# Patient Record
Sex: Male | Born: 1965 | Race: White | Hispanic: No | Marital: Single | Smoking: Former smoker
Health system: Southern US, Community
[De-identification: ages and names within clinical notes are randomized; demographics above are authoritative.]

## PROBLEM LIST (undated history)

## (undated) DIAGNOSIS — F419 Anxiety disorder, unspecified: Secondary | ICD-10-CM

## (undated) DIAGNOSIS — I2510 Atherosclerotic heart disease of native coronary artery without angina pectoris: Secondary | ICD-10-CM

## (undated) DIAGNOSIS — E039 Hypothyroidism, unspecified: Secondary | ICD-10-CM

## (undated) DIAGNOSIS — N12 Tubulo-interstitial nephritis, not specified as acute or chronic: Secondary | ICD-10-CM

## (undated) DIAGNOSIS — B338 Other specified viral diseases: Secondary | ICD-10-CM

## (undated) HISTORY — DX: Other specified viral diseases: B33.8

## (undated) HISTORY — PX: APPENDECTOMY: SHX54

## (undated) HISTORY — DX: Other specified viral diseases: N12

## (undated) HISTORY — DX: Anxiety disorder, unspecified: F41.9

## (undated) HISTORY — PX: OTHER SURGICAL HISTORY: SHX169

## (undated) HISTORY — DX: Tubulo-interstitial nephritis, not specified as acute or chronic: N12

---

## 1998-07-16 ENCOUNTER — Encounter: Payer: Self-pay | Admitting: Emergency Medicine

## 1998-07-16 ENCOUNTER — Emergency Department (HOSPITAL_COMMUNITY): Admission: EM | Admit: 1998-07-16 | Discharge: 1998-07-16 | Payer: Self-pay | Admitting: Emergency Medicine

## 2010-07-27 ENCOUNTER — Encounter: Payer: Self-pay | Admitting: Family Medicine

## 2010-12-08 ENCOUNTER — Emergency Department (INDEPENDENT_AMBULATORY_CARE_PROVIDER_SITE_OTHER): Payer: BC Managed Care – PPO

## 2010-12-08 ENCOUNTER — Emergency Department (HOSPITAL_BASED_OUTPATIENT_CLINIC_OR_DEPARTMENT_OTHER)
Admission: EM | Admit: 2010-12-08 | Discharge: 2010-12-08 | Disposition: A | Discharge status: Home / Self Care | Payer: BC Managed Care – PPO | Attending: Emergency Medicine | Admitting: Emergency Medicine

## 2010-12-08 DIAGNOSIS — E039 Hypothyroidism, unspecified: Secondary | ICD-10-CM | POA: Insufficient documentation

## 2010-12-08 DIAGNOSIS — W19XXXA Unspecified fall, initial encounter: Secondary | ICD-10-CM

## 2010-12-08 DIAGNOSIS — S6390XA Sprain of unspecified part of unspecified wrist and hand, initial encounter: Secondary | ICD-10-CM | POA: Insufficient documentation

## 2010-12-08 DIAGNOSIS — M25549 Pain in joints of unspecified hand: Secondary | ICD-10-CM

## 2010-12-08 DIAGNOSIS — F17200 Nicotine dependence, unspecified, uncomplicated: Secondary | ICD-10-CM | POA: Insufficient documentation

## 2010-12-08 DIAGNOSIS — M25539 Pain in unspecified wrist: Secondary | ICD-10-CM

## 2010-12-08 DIAGNOSIS — R609 Edema, unspecified: Secondary | ICD-10-CM

## 2013-08-18 ENCOUNTER — Encounter: Payer: Self-pay | Admitting: *Deleted

## 2013-08-18 DIAGNOSIS — E039 Hypothyroidism, unspecified: Secondary | ICD-10-CM | POA: Insufficient documentation

## 2014-11-28 ENCOUNTER — Encounter (HOSPITAL_COMMUNITY): Payer: Self-pay | Admitting: Emergency Medicine

## 2014-11-28 ENCOUNTER — Observation Stay (HOSPITAL_COMMUNITY)
Admission: EM | Admit: 2014-11-28 | Discharge: 2014-11-30 | Disposition: A | Discharge status: Home / Self Care | Payer: Worker's Compensation | Attending: Cardiovascular Disease | Admitting: Cardiovascular Disease

## 2014-11-28 ENCOUNTER — Emergency Department (HOSPITAL_COMMUNITY): Payer: Worker's Compensation

## 2014-11-28 DIAGNOSIS — Z8249 Family history of ischemic heart disease and other diseases of the circulatory system: Secondary | ICD-10-CM | POA: Insufficient documentation

## 2014-11-28 DIAGNOSIS — Z6841 Body Mass Index (BMI) 40.0 and over, adult: Secondary | ICD-10-CM | POA: Insufficient documentation

## 2014-11-28 DIAGNOSIS — F419 Anxiety disorder, unspecified: Secondary | ICD-10-CM | POA: Diagnosis not present

## 2014-11-28 DIAGNOSIS — I2510 Atherosclerotic heart disease of native coronary artery without angina pectoris: Secondary | ICD-10-CM | POA: Diagnosis not present

## 2014-11-28 DIAGNOSIS — E785 Hyperlipidemia, unspecified: Secondary | ICD-10-CM | POA: Insufficient documentation

## 2014-11-28 DIAGNOSIS — Z87891 Personal history of nicotine dependence: Secondary | ICD-10-CM | POA: Insufficient documentation

## 2014-11-28 DIAGNOSIS — R079 Chest pain, unspecified: Secondary | ICD-10-CM | POA: Diagnosis present

## 2014-11-28 DIAGNOSIS — N058 Unspecified nephritic syndrome with other morphologic changes: Secondary | ICD-10-CM | POA: Diagnosis not present

## 2014-11-28 DIAGNOSIS — I200 Unstable angina: Secondary | ICD-10-CM | POA: Diagnosis not present

## 2014-11-28 DIAGNOSIS — E039 Hypothyroidism, unspecified: Secondary | ICD-10-CM | POA: Diagnosis present

## 2014-11-28 HISTORY — DX: Hypothyroidism, unspecified: E03.9

## 2014-11-28 HISTORY — DX: Atherosclerotic heart disease of native coronary artery without angina pectoris: I25.10

## 2014-11-28 HISTORY — DX: Morbid (severe) obesity due to excess calories: E66.01

## 2014-11-28 LAB — COMPREHENSIVE METABOLIC PANEL
ALK PHOS: 57 U/L (ref 38–126)
ALT: 39 U/L (ref 17–63)
ANION GAP: 8 (ref 5–15)
AST: 31 U/L (ref 15–41)
Albumin: 4 g/dL (ref 3.5–5.0)
BUN: 18 mg/dL (ref 6–20)
CHLORIDE: 112 mmol/L — AB (ref 101–111)
CO2: 21 mmol/L — AB (ref 22–32)
Calcium: 9.2 mg/dL (ref 8.9–10.3)
Creatinine, Ser: 1.16 mg/dL (ref 0.61–1.24)
GFR calc Af Amer: >60 mL/min (ref >60)
GFR calc non Af Amer: >60 mL/min (ref >60)
GLUCOSE: 86 mg/dL (ref 65–99)
POTASSIUM: 4.1 mmol/L (ref 3.5–5.1)
Sodium: 141 mmol/L (ref 135–145)
Total Bilirubin: 0.6 mg/dL (ref 0.3–1.2)
Total Protein: 6.6 g/dL (ref 6.5–8.1)

## 2014-11-28 LAB — CBC WITH DIFFERENTIAL/PLATELET
BASOS ABS: 0 10*3/uL (ref 0.0–0.1)
BASOS PCT: 0 % (ref 0–1)
Eosinophils Absolute: 0.1 10*3/uL (ref 0.0–0.7)
Eosinophils Relative: 1 % (ref 0–5)
HCT: 40 % (ref 39.0–52.0)
Hemoglobin: 13.7 g/dL (ref 13.0–17.0)
Lymphocytes Relative: 24 % (ref 12–46)
Lymphs Abs: 2 10*3/uL (ref 0.7–4.0)
MCH: 32.2 pg (ref 26.0–34.0)
MCHC: 34.3 g/dL (ref 30.0–36.0)
MCV: 94.1 fL (ref 78.0–100.0)
MONOS PCT: 11 % (ref 3–12)
Monocytes Absolute: 0.9 10*3/uL (ref 0.1–1.0)
NEUTROS PCT: 64 % (ref 43–77)
Neutro Abs: 5.1 10*3/uL (ref 1.7–7.7)
PLATELETS: 183 10*3/uL (ref 150–400)
RBC: 4.25 MIL/uL (ref 4.22–5.81)
RDW: 12.6 % (ref 11.5–15.5)
WBC: 8 10*3/uL (ref 4.0–10.5)

## 2014-11-28 LAB — TSH: TSH: 0.259 u[IU]/mL — ABNORMAL LOW (ref 0.350–4.500)

## 2014-11-28 LAB — TROPONIN I: Troponin I: <0.03 ng/mL (ref <0.031)

## 2014-11-28 MED ORDER — HEPARIN SODIUM (PORCINE) 5000 UNIT/ML IJ SOLN
5000.0000 [IU] | Freq: Three times a day (TID) | INTRAMUSCULAR | Status: DC
  Administered 2014-11-29 – 2014-11-30 (×3): 5000 [IU] via SUBCUTANEOUS
  Filled 2014-11-28 (×3): qty 1

## 2014-11-28 MED ORDER — METOPROLOL TARTRATE 12.5 MG HALF TABLET
12.5000 mg | ORAL_TABLET | Freq: Two times a day (BID) | ORAL | Status: DC
  Administered 2014-11-28 – 2014-11-30 (×4): 12.5 mg via ORAL
  Filled 2014-11-28 (×4): qty 1

## 2014-11-28 MED ORDER — LEVOTHYROXINE SODIUM 25 MCG PO TABS
125.0000 ug | ORAL_TABLET | Freq: Every day | ORAL | Status: DC
  Administered 2014-11-29 – 2014-11-30 (×2): 125 ug via ORAL
  Filled 2014-11-28 (×4): qty 1

## 2014-11-28 MED ORDER — ACETAMINOPHEN 325 MG PO TABS: 650.0000 mg | ORAL_TABLET | ORAL | Status: DC | PRN

## 2014-11-28 MED ORDER — ASPIRIN EC 81 MG PO TBEC
81.0000 mg | DELAYED_RELEASE_TABLET | Freq: Every day | ORAL | Status: DC
  Filled 2014-11-28: qty 1

## 2014-11-28 MED ORDER — SODIUM CHLORIDE 0.9 % IJ SOLN
3.0000 mL | Freq: Two times a day (BID) | INTRAMUSCULAR | Status: DC
  Administered 2014-11-28 – 2014-11-29 (×2): 3 mL via INTRAVENOUS

## 2014-11-28 MED ORDER — LAMOTRIGINE 100 MG PO TABS
100.0000 mg | ORAL_TABLET | Freq: Every day | ORAL | Status: DC
  Administered 2014-11-28 – 2014-11-29 (×2): 100 mg via ORAL
  Filled 2014-11-28 (×2): qty 1

## 2014-11-28 MED ORDER — NITROGLYCERIN 0.4 MG SL SUBL: 0.4000 mg | SUBLINGUAL_TABLET | SUBLINGUAL | Status: DC | PRN

## 2014-11-28 MED ORDER — SODIUM CHLORIDE 0.9 % IV SOLN: 250.0000 mL | INTRAVENOUS | Status: DC | PRN

## 2014-11-28 MED ORDER — SODIUM CHLORIDE 0.9 % WEIGHT BASED INFUSION
1.0000 mL/kg/h | INTRAVENOUS | Status: DC
  Administered 2014-11-29: 1 mL/kg/h via INTRAVENOUS

## 2014-11-28 MED ORDER — DIVALPROEX SODIUM ER 500 MG PO TB24
1500.0000 mg | ORAL_TABLET | Freq: Every day | ORAL | Status: DC
  Administered 2014-11-28 – 2014-11-29 (×2): 1500 mg via ORAL
  Filled 2014-11-28 (×4): qty 3

## 2014-11-28 MED ORDER — ASPIRIN 81 MG PO CHEW
81.0000 mg | CHEWABLE_TABLET | ORAL | Status: AC
  Administered 2014-11-29: 81 mg via ORAL
  Filled 2014-11-28: qty 1

## 2014-11-28 MED ORDER — SODIUM CHLORIDE 0.9 % IJ SOLN: 3.0000 mL | INTRAMUSCULAR | Status: DC | PRN

## 2014-11-28 MED ORDER — ATORVASTATIN CALCIUM 80 MG PO TABS
80.0000 mg | ORAL_TABLET | Freq: Every day | ORAL | Status: DC
  Administered 2014-11-29: 80 mg via ORAL
  Filled 2014-11-28: qty 1

## 2014-11-28 MED ORDER — ONDANSETRON HCL 4 MG/2ML IJ SOLN: 4.0000 mg | Freq: Four times a day (QID) | INTRAMUSCULAR | Status: DC | PRN

## 2014-11-28 MED ORDER — SODIUM CHLORIDE 0.9 % IV SOLN
INTRAVENOUS | Status: DC
  Administered 2014-11-28: 10 mL/h via INTRAVENOUS

## 2014-11-28 MED ORDER — SODIUM CHLORIDE 0.9 % IJ SOLN: 3.0000 mL | Freq: Two times a day (BID) | INTRAMUSCULAR | Status: DC

## 2014-11-28 MED ORDER — SODIUM CHLORIDE 0.9 % WEIGHT BASED INFUSION
3.0000 mL/kg/h | INTRAVENOUS | Status: AC
  Administered 2014-11-29: 3 mL/kg/h via INTRAVENOUS

## 2014-11-28 NOTE — ED Provider Notes (Signed)
CSN: [id number]     Arrival date & time 11/28/14  1333 History   First MD Initiated Contact with Patient 11/28/14 1349     Chief Complaint  Patient presents with  . Chest Pain     (Consider location/radiation/quality/duration/timing/severity/associated sxs/prior Treatment) HPI Comments: Patient here complaining of sudden onset of substernal chest pain while doing agility skills. 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 Symptoms improved when he rested. No prior history of CAD. EMS called and patient given aspirin and transported here. Continues to note slight pleuritic pain to left side of his chest. Denies any leg pain or swelling.  Patient is a 49 y.o. male presenting with chest pain. The history is provided by the patient.  Chest Pain   Past Medical History  Diagnosis Date  . Thyroid disease   . Anxiety   . 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 Appendectomy    . Varicose vein stripping     Family History  Problem Relation Age of Onset  . Cancer Mother   . Alzheimer's disease Father   . Heart attack Paternal Uncle   . Heart attack Maternal Grandfather    History  Substance Use Topics  . Smoking status: Former Smoker    Quit date: 02/15/2005  . Smokeless tobacco: Never Used  . Alcohol Use: Yes    Review of Systems  Cardiovascular: Positive for chest pain.  All other systems reviewed and are negative.     Allergies  Penicillin g potassium in d5w  Home Medications   Prior to Admission medications   Medication Sig Start Date End Date Taking? Authorizing Provider  divalproex (DEPAKOTE) 500 MG DR tablet Take 500 mg by mouth 3 (three) times daily.    Historical Provider, MD  lamoTRIgine (LAMICTAL) 100 MG tablet Take 100 mg by mouth daily.    Historical Provider, MD  levothyroxine (SYNTHROID, LEVOTHROID) 125  MCG tablet Take 125 mcg by mouth daily before breakfast.    Historical Provider, MD   BP 125/83 mmHg  Pulse 82  Temp(Src) 98.4 F (36.9 C) (Oral)  Resp 16  Ht 5\' 10"  (1.778 m)  Wt 310 lb (140.615 kg)  BMI 44.48 kg/m2  SpO2 100% Physical Exam  Constitutional: He is oriented to person, place, and time. He appears well-developed and well-nourished.  Non-toxic appearance. No distress.  HENT:  Head: Normocephalic and atraumatic.  Eyes: Conjunctivae, EOM and lids are normal. Pupils are equal, round, and reactive to light.  Neck: Normal range of motion. Neck supple. No tracheal deviation present. No thyroid mass present.  Cardiovascular: Normal rate, regular rhythm and normal heart sounds.  Exam reveals no gallop.   No murmur heard. Pulmonary/Chest: Effort normal and breath sounds normal. No stridor. No respiratory distress. He has no decreased breath sounds. He has no wheezes. He has no rhonchi. He has no rales.  Abdominal: Soft. Normal appearance and bowel sounds are normal. He exhibits no distension. There is no tenderness. There is no rebound and no CVA tenderness.  Musculoskeletal: Normal range of motion. He exhibits no edema or tenderness.  Neurological: He is alert and oriented to person, place, and time. He has normal strength. No cranial nerve deficit or sensory deficit. GCS eye subscore is 4. GCS verbal subscore is 5. GCS motor subscore is 6.  Skin: Skin is warm and dry. No abrasion and no  rash noted.  Psychiatric: He has a normal mood and affect. His speech is normal and behavior is normal.  Nursing note and vitals reviewed.   ED Course  Procedures (including critical care time) Labs Review Labs Reviewed  TROPONIN I  CBC WITH DIFFERENTIAL/PLATELET  COMPREHENSIVE METABOLIC PANEL    Imaging Review No results found.   EKG Interpretation   Date/Time:  Wednesday Nov 28 2014 13:40:23 EDT Ventricular Rate:  83 PR Interval:  173 QRS Duration: 91 QT Interval:  370 QTC  Calculation: 435 R Axis:   53 Text Interpretation:  Sinus rhythm No significant change since last  tracing Confirmed by Aiken  MD, Tiny Rietz ([id number]) on 11/28/2014 2:02:03 PM      MDM   Final diagnoses:  Chest pain     Patient with symptoms concerning for exertional angina. Will consult cardiology   Lacretia Leigh, MD 11/28/14 [phone]

## 2014-11-28 NOTE — ED Notes (Signed)
MD Jamicah at bedside.

## 2014-11-28 NOTE — ED Notes (Signed)
MD at bedside. Cardiology 

## 2014-11-28 NOTE — ED Notes (Signed)
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 Pt fell to the ground but denies any LOC states he just felt weak. On arrival to ED pt denies and chest pain or SOB. Pt is warm and dry.

## 2014-11-28 NOTE — H&P (Signed)
Patient ID: Jerome Rogers MRN: [id number], DOB/AGE: Apr 08, 1966   Admit date: 11/28/2014  Primary Physician: Orpah Melter, MD Primary Cardiologist: new - seen by M. Rony Ratz, MD   Pt. Profile:  49 year old male with PMH significant for hypothyroidism and anxiety presenting with substernal chest pain during physical exertion.   Problem List  Past Medical History  Diagnosis Date  . Hypothyroidism   . Anxiety   . 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 Morbid obesity     Past Surgical History  Procedure Laterality Date  . Appendectomy    . Varicose vein stripping       Allergies  Allergies  Allergen Reactions  . Penicillin G Potassium In D5w     Unknown     HPI  Jerome Rogers is a 49 year old male with PMH significant per above. 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 He says that after dragging a heavy fire hose over a long distance, he immediately felt an intense, squeezing pain in his left chest. He was then supposed to climb up a tall ladder, but was unable to do this due to pain and sat down to rest. The pain spontaneously resolved with 5 minutes of rest. After resting, he only noticed the pain when taking a deep breath, which he continues to experience now in the ED. The pain now feels like a squeezing sensation just medial to the left nipple when he takes a deep breath. The character of the pain does not change with position. He states that the pain feels superficial as opposed to deep in his chest. Chest wall is not tender to palpation. He is not in distress and he is not having continuous or severe chest pain.   He is taking his synthroid as prescribed.   Home Medications  Prior to Admission medications   Medication Sig Start Date End Date Taking? Authorizing Provider  levothyroxine (SYNTHROID, LEVOTHROID) 125 MCG tablet Take 125 mcg by mouth daily  before breakfast.   Yes Historical Provider, MD    Family History  Family History  Problem Relation Age of Onset  . Cancer Mother     alive and well.  . Alzheimer's disease Father   . Heart attack Paternal Uncle   . Heart attack Maternal Grandfather   . CAD Father     cabg and redo cabg - began in late 83's.  died @ 48.  . Parkinson's disease Father   . Other Sister     alive and well.    Social History  History   Social History  . Marital Status: Single    Spouse Name: N/A  . Number of Children: N/A  . Years of Education: N/A   Occupational History  . Not on file.   Social History Main Topics  . Smoking status: Former Smoker -- 1.00 packs/day for 30 years    Types: Cigarettes    Quit date: 02/15/2005  . Smokeless tobacco: Never Used  . Alcohol Use: 0.0 oz/week    0 Standard drinks or equivalent per week     Comment: social - not everyday.  . Drug Use: No  . Sexual Activity: Not on file   Other Topics Concern  . Not on file   Social History Narrative   Lives with Deale, dtr (14), and son (80).  Works for [employer] and volunteers @ others.  Review of Systems General:  No chills, fever, night sweats or weight changes.  Cardiovascular:  Positive for chest pain. No dyspnea on exertion, edema, orthopnea, palpitations, paroxysmal nocturnal dyspnea. Dermatological: No rash, lesions/masses Respiratory: No cough, dyspnea Urologic: No hematuria, dysuria Abdominal:   +++ nausea, no vomiting, diarrhea, bright red blood per rectum, melena, or hematemesis Neurologic:  No visual changes, wkns, changes in mental status. All other systems reviewed and are otherwise negative except as noted above.  Physical Exam  Blood pressure 114/83, pulse 68, temperature 98.4 F (36.9 C), temperature source Oral, resp. rate 14, height 5\' 10"  (1.778 m), weight 310 lb (140.615 kg), SpO2 98 %.  General: Pleasant, morbidly obese, NAD Psych: Normal affect. Neuro: Alert and  oriented X 3. Moves all extremities spontaneously. HEENT: Normal  Neck: Supple without bruits or JVD. Lungs:  Resp regular and unlabored, CTA. Heart: RRR no s3, s4, or murmurs. Abdomen: Soft, non-tender, non-distended, BS + x 4.  Extremities: No clubbing, cyanosis or edema. DP/PT/Radials 2+ and equal bilaterally.  Labs   Recent Labs  11/28/14 1428  TROPONINI <0.03   Lab Results  Component Value Date   WBC 8.0 11/28/2014   HGB 13.7 11/28/2014   HCT 40.0 11/28/2014   MCV 94.1 11/28/2014   PLT 183 11/28/2014     Recent Labs Lab 11/28/14 1428  NA 141  K 4.1  CL 112*  CO2 21*  BUN 18  CREATININE 1.16  CALCIUM 9.2  PROT 6.6  BILITOT 0.6  ALKPHOS 57  ALT 39  AST 31  GLUCOSE 86    Radiology/Studies  Dg Chest 2 View  11/28/2014   CLINICAL DATA:  Acute onset chest pain and weakness  EXAM: CHEST  2 VIEW  COMPARISON:  None.  FINDINGS: There is minimal scarring in the left base. Lungs elsewhere clear. Heart size and pulmonary vascularity are normal. No adenopathy. Pneumothorax. There is degenerative change in the thoracic spine.  IMPRESSION: No edema or consolidation.  Slight scarring left base.   Electronically Signed   By: Lowella Grip III M.D.   On: 11/28/2014 14:56   ECG  NSR, rate 83, good R wave progression.   ASSESSMENT AND PLAN  1. Unstable Angina:  49 y/o male w/o prior cardiac hx presented to ED today with exertional c/p during FD workout, relieved with rest.  He still has some residual pleuritic c/p.  No obj evidence of ischemia.  RF for CAD include FH (father), obesity, and reported untreated HL. Admit, cycle CE.  Add asa, statin, bb.  Add heparin if he rules in.  Plan cath in AM.  The patient understands that risks include but are not limited to stroke (1 in 1000), death (1 in 1000), kidney failure [usually temporary] (1 in 500), bleeding (1 in 200), allergic reaction [possibly serious] (1 in 200), and agrees to proceed.    2.  HL:  He reports a h/o  untreated HL.  Add statin in setting of above.  Check lipids in AM.  LFT's wnl.  3.  Hypothyroidism:  Cont synthroid.  Check TSH.  4.  Morbid Obesity: Would benefit from outpt nutritional counseling.  Signed, Murray Hodgkins, NP 11/28/2014, 5:06 PM  I have seen and examined the patient along with Murray Hodgkins, NP.  I have reviewed the chart, notes and new data.  I agree with PA/NP's note.  Key new complaints: while not entirely typical, symptoms suggest angina pectoris; numerous coronary risk factors include gender, obesity with abdominal distribution, untreated hypercholesterolemia,  family history of premature CAD  In his father and paternal grandfather and previous smoking Key examination changes: no overt CHF, no arrhythmia, clear lung exam, no pleural or pericardial rub Key new findings / data: ECG and labs normal  PLAN: His body habitus makes it likely that a nuclear perfusion study or other noninvasive imaging study will have low specificity. Recommend proceeding directly to coronary angiography for what appears to be exertional angina with protracted symptoms after rest.  Sanda Klein, MD, [place] [phone] 11/28/2014, 5:30 PM

## 2014-11-29 ENCOUNTER — Encounter (HOSPITAL_COMMUNITY)
Admission: EM | Disposition: A | Discharge status: Home / Self Care | Payer: Self-pay | Source: Home / Self Care | Attending: Emergency Medicine

## 2014-11-29 DIAGNOSIS — F419 Anxiety disorder, unspecified: Secondary | ICD-10-CM | POA: Diagnosis not present

## 2014-11-29 DIAGNOSIS — I25110 Atherosclerotic heart disease of native coronary artery with unstable angina pectoris: Secondary | ICD-10-CM | POA: Diagnosis not present

## 2014-11-29 DIAGNOSIS — E785 Hyperlipidemia, unspecified: Secondary | ICD-10-CM

## 2014-11-29 DIAGNOSIS — R079 Chest pain, unspecified: Secondary | ICD-10-CM | POA: Insufficient documentation

## 2014-11-29 DIAGNOSIS — N058 Unspecified nephritic syndrome with other morphologic changes: Secondary | ICD-10-CM | POA: Diagnosis not present

## 2014-11-29 DIAGNOSIS — I2510 Atherosclerotic heart disease of native coronary artery without angina pectoris: Secondary | ICD-10-CM | POA: Diagnosis not present

## 2014-11-29 HISTORY — PX: CARDIAC CATHETERIZATION: SHX172

## 2014-11-29 LAB — LIPID PANEL
CHOL/HDL RATIO: 5.6 ratio
CHOLESTEROL: 219 mg/dL — AB (ref 0–200)
HDL: 39 mg/dL — ABNORMAL LOW (ref >40)
LDL Cholesterol: 142 mg/dL — ABNORMAL HIGH (ref 0–99)
Triglycerides: 189 mg/dL — ABNORMAL HIGH (ref <150)
VLDL: 38 mg/dL (ref 0–40)

## 2014-11-29 LAB — TROPONIN I
Troponin I: <0.03 ng/mL (ref <0.031)
Troponin I: <0.03 ng/mL (ref <0.031)

## 2014-11-29 LAB — PROTIME-INR
INR: 1.25 (ref 0.00–1.49)
Prothrombin Time: 15.8 seconds — ABNORMAL HIGH (ref 11.6–15.2)

## 2014-11-29 LAB — POCT ACTIVATED CLOTTING TIME: Activated Clotting Time: 165 seconds

## 2014-11-29 SURGERY — LEFT HEART CATH AND CORONARY ANGIOGRAPHY

## 2014-11-29 MED ORDER — VERAPAMIL HCL 2.5 MG/ML IV SOLN
INTRAVENOUS | Status: DC | PRN
  Administered 2014-11-29: 17:00:00 via INTRA_ARTERIAL

## 2014-11-29 MED ORDER — MIDAZOLAM HCL 2 MG/2ML IJ SOLN
INTRAMUSCULAR | Status: DC | PRN
  Administered 2014-11-29: 2 mg via INTRAVENOUS
  Administered 2014-11-29: 1 mg via INTRAVENOUS

## 2014-11-29 MED ORDER — LIDOCAINE HCL (PF) 1 % IJ SOLN
INTRAMUSCULAR | Status: AC
  Filled 2014-11-29: qty 30

## 2014-11-29 MED ORDER — LIDOCAINE HCL (PF) 1 % IJ SOLN
INTRAMUSCULAR | Status: DC | PRN
  Administered 2014-11-29: 10 mL via INTRADERMAL
  Administered 2014-11-29: 5 mL via INTRADERMAL

## 2014-11-29 MED ORDER — SODIUM CHLORIDE 0.9 % IJ SOLN: 3.0000 mL | INTRAMUSCULAR | Status: DC | PRN

## 2014-11-29 MED ORDER — SODIUM CHLORIDE 0.9 % IV SOLN: 250.0000 mL | INTRAVENOUS | Status: DC | PRN

## 2014-11-29 MED ORDER — FENTANYL CITRATE (PF) 100 MCG/2ML IJ SOLN
INTRAMUSCULAR | Status: AC
  Filled 2014-11-29: qty 2

## 2014-11-29 MED ORDER — SODIUM CHLORIDE 0.9 % IJ SOLN
3.0000 mL | INTRAMUSCULAR | Status: DC | PRN
Start: 2014-11-29 — End: 2014-11-29

## 2014-11-29 MED ORDER — MIDAZOLAM HCL 2 MG/2ML IJ SOLN
INTRAMUSCULAR | Status: AC
  Filled 2014-11-29: qty 2

## 2014-11-29 MED ORDER — NITROGLYCERIN 1 MG/10 ML FOR IR/CATH LAB
INTRA_ARTERIAL | Status: AC
  Filled 2014-11-29: qty 10

## 2014-11-29 MED ORDER — FENTANYL CITRATE (PF) 100 MCG/2ML IJ SOLN
INTRAMUSCULAR | Status: DC | PRN
  Administered 2014-11-29: 50 ug via INTRAVENOUS
  Administered 2014-11-29: 25 ug via INTRAVENOUS

## 2014-11-29 MED ORDER — SODIUM CHLORIDE 0.9 % IV SOLN
INTRAVENOUS | Status: AC
  Administered 2014-11-29: 150 mL/h via INTRAVENOUS

## 2014-11-29 MED ORDER — ASPIRIN EC 81 MG PO TBEC
81.0000 mg | DELAYED_RELEASE_TABLET | Freq: Every day | ORAL | Status: DC
  Administered 2014-11-30: 81 mg via ORAL
  Filled 2014-11-29: qty 1

## 2014-11-29 MED ORDER — ASPIRIN 81 MG PO CHEW: 81.0000 mg | CHEWABLE_TABLET | ORAL | Status: DC

## 2014-11-29 MED ORDER — SODIUM CHLORIDE 0.9 % IJ SOLN: 3.0000 mL | Freq: Two times a day (BID) | INTRAMUSCULAR | Status: DC

## 2014-11-29 MED ORDER — SODIUM CHLORIDE 0.9 % IJ SOLN
3.0000 mL | Freq: Two times a day (BID) | INTRAMUSCULAR | Status: DC
  Administered 2014-11-29 – 2014-11-30 (×2): 3 mL via INTRAVENOUS

## 2014-11-29 MED ORDER — SODIUM CHLORIDE 0.9 % IV SOLN
Freq: Once | INTRAVENOUS | Status: AC
  Administered 2014-11-29: 12:00:00 via INTRAVENOUS

## 2014-11-29 MED ORDER — HEPARIN SODIUM (PORCINE) 1000 UNIT/ML IJ SOLN
INTRAMUSCULAR | Status: DC | PRN
  Administered 2014-11-29: 5000 [IU] via INTRAVENOUS

## 2014-11-29 MED ORDER — ACETAMINOPHEN 325 MG PO TABS
650.0000 mg | ORAL_TABLET | ORAL | Status: DC | PRN
  Administered 2014-11-30: 650 mg via ORAL
  Filled 2014-11-29: qty 2

## 2014-11-29 MED ORDER — HEPARIN (PORCINE) IN NACL 2-0.9 UNIT/ML-% IJ SOLN
INTRAMUSCULAR | Status: AC
  Filled 2014-11-29: qty 1000

## 2014-11-29 MED ORDER — ONDANSETRON HCL 4 MG/2ML IJ SOLN: 4.0000 mg | Freq: Four times a day (QID) | INTRAMUSCULAR | Status: DC | PRN

## 2014-11-29 SURGICAL SUPPLY — 19 items
CATH INFINITI 5 FR JL3.5 (CATHETERS) ×2 IMPLANT
CATH INFINITI 5FR ANG PIGTAIL (CATHETERS) ×3 IMPLANT
CATH INFINITI 5FR JL4 (CATHETERS) ×2 IMPLANT
CATH INFINITI 5FR MULTPACK ANG (CATHETERS) IMPLANT
CATH INFINITI JR4 5F (CATHETERS) ×2 IMPLANT
CATH LAUNCHER 5F RADL (CATHETERS) IMPLANT
CATH OPTITORQUE TIG 4.0 5F (CATHETERS) ×3 IMPLANT
CATHETER LAUNCHER 5F RADL (CATHETERS) ×3
DEVICE RAD COMP TR BAND LRG (VASCULAR PRODUCTS) ×3 IMPLANT
GLIDESHEATH SLEND A-KIT 6F 22G (SHEATH) ×3 IMPLANT
KIT HEART LEFT (KITS) ×3 IMPLANT
PACK CARDIAC CATHETERIZATION (CUSTOM PROCEDURE TRAY) ×3 IMPLANT
SHEATH PINNACLE 5F 10CM (SHEATH) ×2 IMPLANT
SYR MEDRAD MARK V 150ML (SYRINGE) ×3 IMPLANT
TRANSDUCER W/STOPCOCK (MISCELLANEOUS) ×3 IMPLANT
TUBING CIL FLEX 10 FLL-RA (TUBING) ×3 IMPLANT
WIRE EMERALD 3MM-J .035X150CM (WIRE) IMPLANT
WIRE HI TORQ VERSACORE-J 145CM (WIRE) ×2 IMPLANT
WIRE SAFE-T 1.5MM-J .035X260CM (WIRE) ×3 IMPLANT

## 2014-11-29 NOTE — Progress Notes (Signed)
Patient Name: Jerome Rogers Date of Encounter: 11/29/2014  Primary Cardiologist: new - seen by M. Croitoru, MD    Principal Problem:   Unstable angina Active Problems:   Hypothyroidism    SUBJECTIVE  Still having mild chest discomfort last night, worse when taking deep breath. Recounted what happened yesterday and said CP alleviated after resting.   CURRENT MEDS . aspirin EC  81 mg Oral Daily  . atorvastatin  80 mg Oral q1800  . divalproex  1,500 mg Oral Daily  . heparin  5,000 Units Subcutaneous 3 times per day  . lamoTRIgine  100 mg Oral Daily  . levothyroxine  125 mcg Oral QAC breakfast  . metoprolol tartrate  12.5 mg Oral BID  . sodium chloride  3 mL Intravenous Q12H  . sodium chloride  3 mL Intravenous Q12H    OBJECTIVE  Filed Vitals:   11/28/14 1745 11/28/14 1811 11/28/14 2017 11/29/14 0518  BP: 135/70 133/88 142/74 115/67  Pulse: 74 71 74 71  Temp:  97.7 F (36.5 C) 98 F (36.7 C) 98.4 F (36.9 C)  TempSrc:  Oral Oral Oral  Resp: 21 17 20 16   Height:  5\' 10"  (1.778 m)    Weight:  289 lb (131.09 kg)    SpO2: 99% 100% 97% 99%    Intake/Output Summary (Last 24 hours) at 11/29/14 0817 Last data filed at 11/29/14 0810  Gross per 24 hour  Intake 427.17 ml  Output      0 ml  Net 427.17 ml   Filed Weights   11/28/14 1347 11/28/14 1811  Weight: 310 lb (140.615 kg) 289 lb (131.09 kg)    PHYSICAL EXAM  General: Pleasant, NAD. Neuro: Alert and oriented X 3. Moves all extremities spontaneously. Psych: Normal affect. HEENT:  Normal  Neck: Supple without bruits or JVD. Lungs:  Resp regular and unlabored, CTA. Heart: RRR no s3, s4, or murmurs. Abdomen: Soft, non-tender, non-distended, BS + x 4.  Extremities: No clubbing, cyanosis or edema. DP/PT/Radials 2+ and equal bilaterally.  Accessory Clinical Findings  CBC  Recent Labs  11/28/14 1428  WBC 8.0  NEUTROABS 5.1  HGB 13.7  HCT 40.0  MCV 94.1  PLT 245   Basic Metabolic Panel  Recent  Labs  11/28/14 1428  NA 141  K 4.1  CL 112*  CO2 21*  GLUCOSE 86  BUN 18  CREATININE 1.16  CALCIUM 9.2   Liver Function Tests  Recent Labs  11/28/14 1428  AST 31  ALT 39  ALKPHOS 57  BILITOT 0.6  PROT 6.6  ALBUMIN 4.0   Cardiac Enzymes  Recent Labs  11/28/14 1428 11/28/14 1939 11/29/14 0056  TROPONINI <0.03 <0.03 <0.03   Fasting Lipid Panel  Recent Labs  11/29/14 0056  CHOL 219*  HDL 39*  LDLCALC 142*  TRIG 189*  CHOLHDL 5.6   Thyroid Function Tests  Recent Labs  11/28/14 1939  TSH 0.259*    TELE NSR     ECG  NSR without ST-T wave changes   Radiology/Studies  Dg Chest 2 View  11/28/2014   CLINICAL DATA:  Acute onset chest pain and weakness  EXAM: CHEST  2 VIEW  COMPARISON:  None.  FINDINGS: There is minimal scarring in the left base. Lungs elsewhere clear. Heart size and pulmonary vascularity are normal. No adenopathy. Pneumothorax. There is degenerative change in the thoracic spine.  IMPRESSION: No edema or consolidation.  Slight scarring left base.   Electronically Signed   By: Gwyndolyn Saxon  Jasmine December III M.D.   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   1. Exertional angina  - will plan cardiac cath today, risk and benefit explained yesterday.  - continue ASA, lipitor, BB  2. Hyperlipidemia  - cholesterol 219, trig 189, HDL 39, LDL 142  3. Hypothyroidism: TSH low 0.259  4. Morbidly obesisty  Weston Brass Almyra Deforest PA-C Pager: [phone]   Attending Note:   The patient was seen and examined.  Agree with assessment and plan as noted above.  Changes made to the above note as needed.  Pain sounds more pleuretic. He did have syncope and CP while exercising. Troponin levels are negative  Agree with plans for cath . It will be difficult to clear him to go back to work with out knowing his coronary anatomy ( works as a  Airline pilot)    Thayer Headings, Brooke Bonito., MD, [facility] 11/29/2014, 8:57 AM [address]  [place] Pager [phone]

## 2014-11-30 ENCOUNTER — Encounter (HOSPITAL_COMMUNITY): Payer: Self-pay | Admitting: Cardiovascular Disease

## 2014-11-30 ENCOUNTER — Telehealth: Payer: Self-pay | Admitting: Cardiovascular Disease

## 2014-11-30 ENCOUNTER — Other Ambulatory Visit: Payer: Self-pay

## 2014-11-30 DIAGNOSIS — R079 Chest pain, unspecified: Secondary | ICD-10-CM | POA: Diagnosis not present

## 2014-11-30 DIAGNOSIS — R0782 Intercostal pain: Secondary | ICD-10-CM | POA: Diagnosis not present

## 2014-11-30 DIAGNOSIS — I2510 Atherosclerotic heart disease of native coronary artery without angina pectoris: Secondary | ICD-10-CM | POA: Diagnosis not present

## 2014-11-30 DIAGNOSIS — F419 Anxiety disorder, unspecified: Secondary | ICD-10-CM | POA: Diagnosis not present

## 2014-11-30 DIAGNOSIS — N058 Unspecified nephritic syndrome with other morphologic changes: Secondary | ICD-10-CM | POA: Diagnosis not present

## 2014-11-30 MED ORDER — ASPIRIN 81 MG PO TBEC: 81.0000 mg | DELAYED_RELEASE_TABLET | Freq: Every day | ORAL | Status: AC

## 2014-11-30 MED ORDER — IBUPROFEN 200 MG PO TABS: ORAL_TABLET | ORAL | Status: DC

## 2014-11-30 MED ORDER — ATORVASTATIN CALCIUM 40 MG PO TABS: 40.0000 mg | ORAL_TABLET | Freq: Every day | ORAL | Status: DC

## 2014-11-30 MED FILL — Heparin Sodium (Porcine) 2 Unit/ML in Sodium Chloride 0.9%: INTRAMUSCULAR | Qty: 1000 | Status: AC

## 2014-11-30 NOTE — Progress Notes (Signed)
Patient Name: Jerome Rogers Date of Encounter: 11/30/2014  Principal Problem:   Unstable angina Active Problems:   Hypothyroidism   SUBJECTIVE  Woke up with headache, improved with Tylenol. Denies chest pain, SOB or palpitation.   CURRENT MEDS . aspirin EC  81 mg Oral Daily  . atorvastatin  80 mg Oral q1800  . divalproex  1,500 mg Oral Daily  . heparin  5,000 Units Subcutaneous 3 times per day  . lamoTRIgine  100 mg Oral Daily  . levothyroxine  125 mcg Oral QAC breakfast  . metoprolol tartrate  12.5 mg Oral BID  . sodium chloride  3 mL Intravenous Q12H    OBJECTIVE  Filed Vitals:   11/29/14 2110 11/29/14 2139 11/29/14 2209 11/30/14 0500  BP: 136/79 145/93 138/89 107/54  Pulse:    62  Temp:    98.3 F (36.8 C)  TempSrc:    Oral  Resp: 16 21 16 18   Height:      Weight:    288 lb 4.8 oz (130.772 kg)  SpO2:    99%    Intake/Output Summary (Last 24 hours) at 11/30/14 0850 Last data filed at 11/30/14 0549  Gross per 24 hour  Intake 2300.83 ml  Output   1550 ml  Net 750.83 ml   Filed Weights   11/28/14 1811 11/29/14 1056 11/30/14 0500  Weight: 289 lb (131.09 kg) 288 lb 3.2 oz (130.727 kg) 288 lb 4.8 oz (130.772 kg)    PHYSICAL EXAM  General: Pleasant, NAD. Neuro: Alert and oriented X 3. Moves all extremities spontaneously. Psych: Normal affect. HEENT:  Normal  Neck: Supple without bruits or JVD. Chest  Tender to palpation of L chest  This brings on / makes worse the pain that he had when he came in   Lungs:  Resp regular and unlabored, CTA. Heart: RRR no s3, s4, or murmurs. Abdomen: Soft, non-tender, non-distended, BS + x 4.  Extremities: No clubbing, cyanosis or edema. DP/PT/Radials 2+ and equal bilaterally. R redial cath site without erythema, hematoma or bruit.   Accessory Clinical Findings  CBC  Recent Labs  11/28/14 1428  WBC 8.0  NEUTROABS 5.1  HGB 13.7  HCT 40.0  MCV 94.1  PLT 578   Basic Metabolic Panel  Recent Labs  11/28/14 1428   NA 141  K 4.1  CL 112*  CO2 21*  GLUCOSE 86  BUN 18  CREATININE 1.16  CALCIUM 9.2   Liver Function Tests  Recent Labs  11/28/14 1428  AST 31  ALT 39  ALKPHOS 57  BILITOT 0.6  PROT 6.6  ALBUMIN 4.0    Cardiac Enzymes  Recent Labs  11/28/14 1939 11/29/14 0056 11/29/14 0655  TROPONINI <0.03 <0.03 <0.03    Fasting Lipid Panel  Recent Labs  11/29/14 0056  CHOL 219*  HDL 39*  LDLCALC 142*  TRIG 189*  CHOLHDL 5.6   Thyroid Function Tests  Recent Labs  11/28/14 1939  TSH 0.259*    TELE  NSR with rate of 70s.   Radiology/Studies  Dg Chest 2 View  11/28/2014   CLINICAL DATA:  Acute onset chest pain and weakness  EXAM: CHEST  2 VIEW  COMPARISON:  None.  FINDINGS: There is minimal scarring in the left base. Lungs elsewhere clear. Heart size and pulmonary vascularity are normal. No adenopathy. Pneumothorax. There is degenerative change in the thoracic spine.  IMPRESSION: No edema or consolidation.  Slight scarring left base.   Electronically Signed   By:  Lowella Grip III M.D.   On: 11/28/2014 14:56    Cath 11/29/14 Conclusion     Prox LAD lesion, 20% stenosed.  Mid LAD lesion, 20% stenosed.  Prox Cx lesion, 20% stenosed.  Mid Cx lesion, 30% stenosed.  Mid RCA lesion, 10% stenosed.  Mild nonobstructive coronary artery disease.  Low normal LV function with an ejection fraction in the 50-55% range.  RECOMMENDATION:  Medical therapy. Suspect noncardiac chest pain in the etiology of the patient's symptoms.     Coronary Findings    Dominance: Right   Left Anterior Descending   . Prox LAD lesion, 20% stenosed.   . Mid LAD lesion, 20% stenosed.     Left Circumflex   . Prox Cx lesion, 20% stenosed.   . Mid Cx lesion, 30% stenosed.     Right Coronary Artery   . Mid RCA lesion, 10% stenosed.       ASSESSMENT AND PLAN   49 yo male with history of hypothyroidism and obesity presented with exertional CP during [employer]  training relieved with rest.   1. Chest  Appears to be chest wall pain  Reprod on exam today   - Cath showed mild nonobstructive coronary artery disease with LV EF of 50-55%. Likely his symptoms from [facility].  - EKG today NSR with rate of 67.  - Continue ASA, lipitor,   2. Hyperlipidemia - Cholesterol 219, trig 189, HDL 39, LDL 142. Continue statin. Goal LDL less than 100   Needs to lose wt    3. Hypothyroidism:  - TSH low 0.259. Continue synthroid. F/u with PCP for further management.   4. Morbidly obesity - Needs lifestyle modification. Education given.  F/u with PCP for further management.  Signed, Leanor Kail PA-C Pager [phone]  Patient seen and examined  I have amended note above by B Bhagat to reflect my findingds Chest wall pain Would like to see in clinic next week or early after with me or PA Continue statin and ASA with mild plaaquing  Lose wt  Will need note for work  Ok to resume light acitvity until after seen. Needs note to delay billing since out of work  [employer]

## 2014-11-30 NOTE — Discharge Summary (Signed)
Discharge Summary   Patient ID: Jerome Rogers,  MRN: [id number], DOB/AGE: 10-Sep-1965 49 y.o.  Admit date: 11/28/2014 Discharge date: 11/30/2014  Primary Care Provider: Orpah Melter Primary Cardiologist: Dr. Sallyanne Kuster (new)  Discharge Diagnoses Principal Problem:   Unstable angina Active Problems:   Hypothyroidism   Chest pain   Allergies Allergies  Allergen Reactions  . Penicillin G Potassium In D5w     Unknown     Procedures  Cath 11/29/14 Conclusion     Prox LAD lesion, 20% stenosed.  Mid LAD lesion, 20% stenosed.  Prox Cx lesion, 20% stenosed.  Mid Cx lesion, 30% stenosed.  Mid RCA lesion, 10% stenosed.  Mild nonobstructive coronary artery disease.  Low normal LV function with an ejection fraction in the 50-55% range.  RECOMMENDATION:  Medical therapy. Suspect noncardiac chest pain in the etiology of the patient's symptoms.     Coronary Findings    Dominance: Right   Left Anterior Descending   . Prox LAD lesion, 20% stenosed.   . Mid LAD lesion, 20% stenosed.     Left Circumflex   . Prox Cx lesion, 20% stenosed.   . Mid Cx lesion, 30% stenosed.     Right Coronary Artery   . Mid RCA lesion, 10% stenosed.           History of Present Illness  Jerome Rogers is a 49 year old male with PMH of hypothyroidism, anxiety, former smoker, [name] polyoma nephropathy presented to [facility] 11/28/14 for chest pain and syncope evaluation.  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 He said that after dragging a heavy fire hose over a long distance, he immediately felt an intense, squeezing pain in his left chest. He was then supposed to climb up a tall ladder, but was unable to do this due to pain and sat down to rest. The pain spontaneously resolved with 5 minutes of rest. After resting, he only noticed  the pain when taking a deep breath, which he continued to experience in the ED as well. He described the pain as squeezing sensation just medial to the left nipple when he takes a deep breath. The character of the pain does not changed with position. He stated that the pain felt superficial as opposed to deep in his chest. Chest wall was not tender to palpation.  Hospital Course  He was admitted for observation and schedule directly to coronary angiography next morning. Overnight he complained mild chest discomfort that worsen with deep breath. He was placed on ASA,  Statin, BB and heparin. His TSH was 0.259 and his home dose of synthroid was continued. Troponin levels was negative. Cath showed mild nonobstructive coronary artery disease with LV EF of 50-55%. Post cath he was doing well. He denied chest pain, SOB or palpations. However his left side chest pain was reproducible with palpation. This was similar to his initial chest pain.  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 He was advice to take OTC ibuprofen for musculoskeletal pain. His BB was discontinued. Plan to continue ASA and statin with mild plaquing. He was excused from work until seen in clinic 12/05/14. He was also given letter to defer his current billing until next billing cycle upon his request.   Discharge Vitals Blood pressure 123/72, pulse 68, temperature 98.3 F (36.8 C), temperature source Oral, resp. rate  18, height 5\' 10"  (1.778 m), weight 288 lb 4.8 oz (130.772 kg), SpO2 99 %.  Filed Weights   11/28/14 1811 11/29/14 1056 11/30/14 0500  Weight: 289 lb (131.09 kg) 288 lb 3.2 oz (130.727 kg) 288 lb 4.8 oz (130.772 kg)    Labs  CBC  Recent Labs  11/28/14 1428  WBC 8.0  NEUTROABS 5.1  HGB 13.7  HCT 40.0  MCV 94.1  PLT 449   Basic Metabolic Panel  Recent Labs  11/28/14 1428  NA 141  K 4.1  CL 112*  CO2 21*  GLUCOSE 86  BUN 18    CREATININE 1.16  CALCIUM 9.2   Liver Function Tests  Recent Labs  11/28/14 1428  AST 31  ALT 39  ALKPHOS 57  BILITOT 0.6  PROT 6.6  ALBUMIN 4.0   Cardiac Enzymes  Recent Labs  11/28/14 1939 11/29/14 0056 11/29/14 0655  TROPONINI <0.03 <0.03 <0.03    Fasting Lipid Panel  Recent Labs  11/29/14 0056  CHOL 219*  HDL 39*  LDLCALC 142*  TRIG 189*  CHOLHDL 5.6   Thyroid Function Tests  Recent Labs  11/28/14 1939  TSH 0.259*    Disposition  Pt is being discharged home today in good condition.  Follow-up Plans & Appointments  Follow-up Information    Follow up with HAGER, BRYAN, PA-C On 12/05/2014.   Specialties:  Physician Assistant, Radiology, Interventional Cardiology   Why:  @ 2:00 TOv   Contact information:   [place] [id number] [phone]       Follow up with PCP.   Contact information:   f/u with your PCP for hypothyroidism and lifesytle modification.          Discharge Instructions    Diet - low sodium heart healthy    Complete by:  As directed      Increase activity slowly    Complete by:  As directed   No driving for 2. No lifting over 5 lbs for 1 week. No sexual activity for 1 week. NO HEAVY LIFTING (>10lbs) X 2 WEEKS. NO SEXUAL ACTIVITY X 2 WEEKS. NO DRIVING X 1 WEEK.. NO SOAKING BATHS, HOT TUBS, POOLS, ETC., X 7 DAYS.  - Further instruction and questions directed to outpatient visit 12/05/2014           F/u Labs/Studies: Recheck LFT in 4-6 weeks as he was started on statin during this admission.   Discharge Medications    Medication List    TAKE these medications        aspirin 81 MG EC tablet  Take 1 tablet (81 mg total) by mouth daily.     atorvastatin 40 MG tablet  Commonly known as:  LIPITOR  Take 1 tablet (40 mg total) by mouth daily at 6 PM.     divalproex 500 MG 24 hr tablet  Commonly known as:  DEPAKOTE ER  Take 1,500 mg by mouth daily.     ibuprofen 200 MG tablet  Commonly  known as:  ADVIL  Take 3 tables by mouth every 6 hours as needed for pain with food.     lamoTRIgine 100 MG tablet  Commonly known as:  LAMICTAL  Take 100 mg by mouth daily.     levothyroxine 125 MCG tablet  Commonly known as:  SYNTHROID, LEVOTHROID  Take 125 mcg by mouth daily before breakfast.        Duration of Discharge Encounter   Greater than 30 minutes  including physician time.  Signed, Alivya Wegman PA-C 11/30/2014, 12:30 PM

## 2014-11-30 NOTE — Progress Notes (Addendum)
Discharged summary was given and explained, client verbalized understanding. Prescriptions were given and client had all of his belongings. Telemetry was removed and CCMD was notified.

## 2014-11-30 NOTE — Care Management (Signed)
1435 11-30-14 Pt has Generic Workers Comp.Policy #: [id number] CM did call Policy to see if they would pay for medications. [employer] Risk Management stated that pharmacy has to call to medication coverage approved. No further needs from CM at this time. Bethena Roys, RN,BSN [phone]

## 2014-11-30 NOTE — Telephone Encounter (Signed)
7 day TOC fu -appt 12-05-14 at 2p with Tarri Fuller

## 2014-11-30 NOTE — Discharge Instructions (Signed)
Chest Wall Pain Chest wall pain is pain in or around the bones and muscles of your chest. It may take up to 6 weeks to get better. It may take longer if you must stay physically active in your work and activities.  CAUSES  Chest wall pain may happen on its own. However, it may be caused by:  A viral illness like the flu.  Injury.  Coughing.  Exercise.  Arthritis.  Fibromyalgia.  Shingles. HOME CARE INSTRUCTIONS   Avoid overtiring physical activity. Try not to strain or perform activities that cause pain. This includes any activities using your chest or your abdominal and side muscles, especially if heavy weights are used.  Put ice on the sore area.  Put ice in a plastic bag.  Place a towel between your skin and the bag.  Leave the ice on for 15-20 minutes per hour while awake for the first 2 days.  Only take over-the-counter or prescription medicines for pain, discomfort, or fever as directed by your caregiver. SEEK IMMEDIATE MEDICAL CARE IF:   Your pain increases, or you are very uncomfortable.  You have a fever.  Your chest pain becomes worse.  You have new, unexplained symptoms.  You have nausea or vomiting.  You feel sweaty or lightheaded.  You have a cough with phlegm (sputum), or you cough up blood. MAKE SURE YOU:   Understand these instructions.  Will watch your condition.  Will get help right away if you are not doing well or get worse. Document Released: 06/22/2005 Document Revised: 09/14/2011 Document Reviewed: 02/16/2011 [facility] Patient Information 2015 [place], [place]. This information is not intended to replace advice given to you by your health care provider. Make sure you discuss any questions you have with your health care provider.  Please contact cardiology or your primary care physician if has any recurrent dizziness or feeling of passing out.

## 2014-12-05 ENCOUNTER — Encounter: Payer: Self-pay | Admitting: *Deleted

## 2014-12-05 ENCOUNTER — Ambulatory Visit (INDEPENDENT_AMBULATORY_CARE_PROVIDER_SITE_OTHER): Payer: Worker's Compensation | Admitting: Physician Assistant

## 2014-12-05 ENCOUNTER — Encounter: Payer: Self-pay | Admitting: Physician Assistant

## 2014-12-05 VITALS — BP 142/86 | HR 69 | Ht 70.0 in | Wt 289.0 lb

## 2014-12-05 DIAGNOSIS — I200 Unstable angina: Secondary | ICD-10-CM | POA: Diagnosis not present

## 2014-12-05 DIAGNOSIS — I2510 Atherosclerotic heart disease of native coronary artery without angina pectoris: Secondary | ICD-10-CM | POA: Insufficient documentation

## 2014-12-05 DIAGNOSIS — I2583 Coronary atherosclerosis due to lipid rich plaque: Secondary | ICD-10-CM

## 2014-12-05 DIAGNOSIS — Z79899 Other long term (current) drug therapy: Secondary | ICD-10-CM | POA: Diagnosis not present

## 2014-12-05 DIAGNOSIS — E66813 Obesity, class 3: Secondary | ICD-10-CM

## 2014-12-05 DIAGNOSIS — E785 Hyperlipidemia, unspecified: Secondary | ICD-10-CM

## 2014-12-05 NOTE — Patient Instructions (Addendum)
Medication Instructions:   Your physician recommends that you continue on your current medications as directed. Please refer to the Current Medication list given to you today.   Labwork:  LFT  in 8 weeks    Testing/Procedures:   Follow-Up:  Your physician wants you to follow-up in:  In 6 months with Dr Gerre Couch will receive a reminder letter in the mail two months in advance. If you don't receive a letter, please call our office to schedule the follow-up appointment.   Any Other Special Instructions Will Be Listed Below (If Applicable).

## 2014-12-05 NOTE — Progress Notes (Signed)
Patient ID: Jerome Rogers, male   DOB: 02-17-1966, 50 y.o.   MRN: [id number]    Date:  12/05/2014   ID:  Trixie Dredge, DOB Aug 27, 1965, MRN [id number]  PCP:  Orpah Melter, MD  Primary Cardiologist:  Croitoru  No chief complaint on file.    History of Present Illness: Jerome Rogers is a 49 y.o. male with PMH of hypothyroidism, anxiety, former smoker, [name] polyoma nephropathy presented to [facility] 11/28/14 for chest pain and syncope evaluation.  He was placed on ASA, Statin, BB and heparin. His TSH was 0.259 and his home dose of synthroid was continued. Troponin levels were negative. Cath showed mild nonobstructive coronary artery disease with LV EF of 50-55%.  Left side chest pain was reproducible with palpation. This was similar to his initial chest pain. 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 He was advice to take OTC ibuprofen for musculoskeletal pain.  Plan to continue ASA and statin with mild plaquing.  Reasons for posthospital evaluation. He reports doing well. He is a little nervous about coming in. He's been taking ibuprofen as directed and pain is improving.  He otherwise denies nausea, vomiting, fever, chest pain, shortness of breath, orthopnea, dizziness, PND, cough, congestion, abdominal pain, hematochezia, melena, lower extremity edema, claudication.  Wt Readings from Last 3 Encounters:  12/05/14 289 lb (131.09 kg)  11/30/14 288 lb 4.8 oz (130.772 kg)    Cath 11/29/14 Conclusion     Prox LAD lesion, 20% stenosed.  Mid LAD lesion, 20% stenosed.  Prox Cx lesion, 20% stenosed.  Mid Cx lesion, 30% stenosed.  Mid RCA lesion, 10% stenosed.            Past Medical History  Diagnosis Date  . Hypothyroidism   . Anxiety   . 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 Morbid obesity   . CAD (coronary artery disease)     cath 11/29/14    Prox LAD lesion, 20% stenosed.     Current Outpatient Prescriptions  Medication Sig Dispense Refill  . aspirin EC 81 MG EC tablet Take 1 tablet (81 mg total) by mouth daily.    Marland Kitchen atorvastatin (LIPITOR) 40 MG tablet Take 1 tablet (40 mg total) by mouth daily at 6 PM. 30 tablet 12  . divalproex (DEPAKOTE ER) 500 MG 24 hr tablet Take 1,500 mg by mouth daily.    Marland Kitchen ibuprofen (ADVIL) 200 MG tablet Take 3 tables by mouth every 6 hours as needed for pain with food. 30 tablet 0  . lamoTRIgine (LAMICTAL) 100 MG tablet Take 100 mg by mouth daily.  0  . levothyroxine (SYNTHROID, LEVOTHROID) 125 MCG tablet Take 125 mcg by mouth daily before breakfast.     No current facility-administered medications for this visit.   Lipid Panel     Component Value Date/Time   CHOL 219* 11/29/2014 0056   TRIG 189* 11/29/2014 0056   HDL 39* 11/29/2014 0056   CHOLHDL 5.6 11/29/2014 0056   VLDL 38 11/29/2014 0056   LDLCALC 142* 11/29/2014 0056      Allergies:    Allergies  Allergen Reactions  . Penicillin G Potassium In D5w Other (See Comments)    Unknown     Social History:  The patient  reports that he quit smoking about 9 years ago. His smoking use included Cigarettes. He has a 30 pack-year smoking history. He has never used smokeless tobacco. He reports that he  drinks alcohol. He reports that he does not use illicit drugs.   Family history:   Family History  Problem Relation Age of Onset  . Cancer Mother     alive and well.  . Alzheimer's disease Father   . Heart attack Paternal Uncle   . Heart attack Maternal Grandfather   . CAD Father     cabg and redo cabg - began in late 90's.  died @ 64.  . Parkinson's disease Father   . Other Sister     alive and well.    ROS:  Please see the history of present illness.  All other systems reviewed and negative.   PHYSICAL EXAM: VS:  BP 142/86 mmHg  Pulse 69  Ht 5\' 10"  (1.778 m)  Wt 289 lb (131.09 kg)  BMI 41.47 kg/m2 Obese well developed, in no acute distress HEENT: Pupils are  equal round react to light accommodation extraocular movements are intact.  Neck: no JVDNo cervical lymphadenopathy. Cardiac: Regular rate and rhythm without murmurs rubs or gallops. Lungs:  clear to auscultation bilaterally, no wheezing, rhonchi or rales Ext: no lower extremity edema.  2+ radial and dorsalis pedis pulses. Skin: warm and dryRadial and right groin cath site stable healing well. Neuro:  Grossly normal  EKG: Normal sinus rhythm 69 bpm.   ASSESSMENT AND PLAN:  Problem List Items Addressed This Visit    RESOLVED: Unstable angina - Primary   Relevant Orders   EKG 12-Lead   Obesity, Class III, BMI 40-49.9 (morbid obesity)    Diet and exercise discussed.      Dyslipidemia    Continue Lipitor. Check LFTs in 8 weeks and lipid panel      Coronary artery disease    He has mild nonobstructive disease. Details listed in overview. He is on aspirin and statin. We discussed in detail diet and exercise. Follow-up in 6 months.        Other Visit Diagnoses    Medication management

## 2014-12-05 NOTE — Assessment & Plan Note (Signed)
Diet and exercise discussed

## 2014-12-05 NOTE — Assessment & Plan Note (Signed)
Continue Lipitor. Check LFTs in 8 weeks and lipid panel

## 2014-12-05 NOTE — Assessment & Plan Note (Addendum)
He has mild nonobstructive disease. Details listed in overview. He is on aspirin and statin. We discussed in detail diet and exercise. Follow-up in 6 months.

## 2014-12-12 ENCOUNTER — Telehealth: Payer: Self-pay | Admitting: Cardiovascular Disease

## 2014-12-12 NOTE — Telephone Encounter (Signed)
Pt called again,he was on my voicemail.

## 2014-12-12 NOTE — Telephone Encounter (Signed)
Phone rang w/ no answer.

## 2014-12-12 NOTE — Telephone Encounter (Signed)
New Message       Pt calling to find out what his official cardiac diagnosis is. Please call back and advise.

## 2014-12-19 NOTE — Telephone Encounter (Signed)
Spoke to patient  Patient states he needs dx ands codes from hospitalizations for insurance purposes RN asked patient if he need records or codes just dx for filling out forms.  patient states he employer will be filling out forms he just need dx.  RN gave him the dx codes on problem list. Patient states now he would like the records. RN informed patient will need to call [facility] to get hospital records and RN  Send message to office medical records to assist patient with obtaining his records from ofice

## 2014-12-19 NOTE — Telephone Encounter (Signed)
Follow Up          Pt calling to find out what his cardiac diagnosis is for his insurance company. Please call back and advise.

## 2015-01-30 ENCOUNTER — Other Ambulatory Visit: Payer: Self-pay

## 2015-01-31 ENCOUNTER — Other Ambulatory Visit (INDEPENDENT_AMBULATORY_CARE_PROVIDER_SITE_OTHER): Payer: BLUE CROSS/BLUE SHIELD | Admitting: *Deleted

## 2015-01-31 ENCOUNTER — Telehealth: Payer: Self-pay | Admitting: Physician Assistant

## 2015-01-31 DIAGNOSIS — Z79899 Other long term (current) drug therapy: Secondary | ICD-10-CM

## 2015-01-31 LAB — HEPATIC FUNCTION PANEL
ALK PHOS: 70 U/L (ref 39–117)
ALT: 30 U/L (ref 0–53)
AST: 23 U/L (ref 0–37)
Albumin: 4.6 g/dL (ref 3.5–5.2)
BILIRUBIN DIRECT: 0.2 mg/dL (ref 0.0–0.3)
Total Bilirubin: 0.8 mg/dL (ref 0.2–1.2)
Total Protein: 7.2 g/dL (ref 6.0–8.3)

## 2015-01-31 NOTE — Addendum Note (Signed)
Addended by: Eulis Foster on: 01/31/2015 10:22 AM   Modules accepted: Orders

## 2015-01-31 NOTE — Telephone Encounter (Signed)
Walk In pt form- Attending Physicians Statement-dropped off by patient sent interoffice to [place].

## 2015-02-25 ENCOUNTER — Telehealth: Payer: Self-pay | Admitting: Cardiovascular Disease

## 2015-02-25 NOTE — Telephone Encounter (Signed)
Received Attending Physician Statement from [place] on my desk 02/25/15.  Gave to B Lassiter for Dr Sallyanne Kuster to review and sign.  lp

## 2015-03-07 ENCOUNTER — Telehealth: Payer: Self-pay | Admitting: Cardiovascular Disease

## 2015-03-07 NOTE — Telephone Encounter (Signed)
Franky Macho received signed Attending Physician Statement (VFIS) back from Dr Sallyanne Kuster on 03/01/15.  Faxed to [employer] on 03/01/15.  Copy mailed to patient.  lp

## 2015-04-18 ENCOUNTER — Encounter: Payer: Self-pay | Admitting: Physician Assistant

## 2015-04-18 ENCOUNTER — Ambulatory Visit (INDEPENDENT_AMBULATORY_CARE_PROVIDER_SITE_OTHER): Payer: BLUE CROSS/BLUE SHIELD | Admitting: Physician Assistant

## 2015-04-18 ENCOUNTER — Telehealth: Payer: Self-pay | Admitting: Cardiovascular Disease

## 2015-04-18 VITALS — BP 102/64 | HR 72 | Ht 70.0 in | Wt 286.0 lb

## 2015-04-18 DIAGNOSIS — R079 Chest pain, unspecified: Secondary | ICD-10-CM | POA: Diagnosis not present

## 2015-04-18 DIAGNOSIS — E785 Hyperlipidemia, unspecified: Secondary | ICD-10-CM | POA: Diagnosis not present

## 2015-04-18 DIAGNOSIS — I2510 Atherosclerotic heart disease of native coronary artery without angina pectoris: Secondary | ICD-10-CM | POA: Diagnosis not present

## 2015-04-18 MED ORDER — FAMOTIDINE 20 MG PO TABS: 20.0000 mg | ORAL_TABLET | Freq: Two times a day (BID) | ORAL | Status: DC

## 2015-04-18 NOTE — Telephone Encounter (Signed)
Mr. Kreider is calling back and is having chest discomfort.. Please call   Thanks

## 2015-04-18 NOTE — Telephone Encounter (Signed)
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  Please call.

## 2015-04-18 NOTE — Patient Instructions (Signed)
Medication Instructions:  1. TAKE IBUPROFEN 800 MG THREE TIMES DAILY FOR 1 WEEK THEN AS NEEDED  2. START PEPCID 20 MG TWICE DAILY FOR 1 WEEK THEN STOP  Labwork: NONE  Testing/Procedures: NONE  Follow-Up: 05/2015 WITH DR. Sallyanne Kuster  Any Other Special Instructions Will Be Listed Below (If Applicable).

## 2015-04-18 NOTE — Telephone Encounter (Signed)
Pt states chest pain, akin to MSK pain he was dx'ed w/ in May. He reports no shortness of breath, no arm pain, no fatigue.  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 Noted BP checked, HR checked at site, [facility]. He states that he does not feel symptoms warrant an ED evaluation - called initially bc he would like to be seen by provider w/in a few days.  Advised if worse, if SOB develops, other concerning symptoms, ED would be most appropriate. Informed him I would contact [facility] for flex clinic options.  Jari Sportsman added for [employer] schedule at 12:10pm today. Pt was contacted and informed of this. He voiced understanding of appt time/loc/provider.

## 2015-04-18 NOTE — Progress Notes (Signed)
Cardiology Office Note   Date:  04/18/2015   ID:  Trixie Dredge, DOB [birth year], MRN [id number]  PCP:  Orpah Melter, MD  Cardiologist:  Dr. Sanda Klein   Electrophysiologist:  n/a  No chief complaint on file.    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  Admitted in 5/16 with chest pain and syncope with exertion.  CEs remained neg.  LHC demonstrated mild non-obstructive CAD.  He was dx with MSK chest pain.  Last seen in clinic 6/16. 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 He was carrying 90+ pound equipment. He started having left-sided chest discomfort and was urged to be seen by his cardiologist. His pain is clearly reproduced with palpation. He denies significant dyspnea. He denies syncope. He denies orthopnea, PND or edema. He denies any recent injuries to his legs. He denies any recent travels. He denies any recent admission to the hospital. He denies a history of pulmonary embolism.   Studies/Reports Reviewed Today:  LHC 11/29/14 LAD:  prox 20%, mid 20% LCx: prox 20%, mid 30% RCA:  Mid 10% Mild nonobstructive coronary artery disease. Low normal LV function with an ejection fraction in the 50-55% range.  Past Medical History  Diagnosis Date  . Hypothyroidism   . Anxiety   . 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 Morbid obesity ([place])   . CAD (coronary artery disease)     cath 11/29/14    Prox LAD lesion, 20% stenosed.    Past Surgical History  Procedure Laterality Date  . Appendectomy    . Varicose vein stripping    . Cardiac catheterization N/A 11/29/2014    Procedure: Left Heart Cath and Coronary Angiography;  Surgeon: Troy Sine, MD;  Location: [place] [facility];  Service: Cardiovascular;  Laterality: N/A;     Current Outpatient Prescriptions  Medication Sig Dispense Refill  . aspirin EC 81 MG EC tablet  Take 1 tablet (81 mg total) by mouth daily.    Marland Kitchen atorvastatin (LIPITOR) 40 MG tablet Take 1 tablet (40 mg total) by mouth daily at 6 PM. 30 tablet 12  . ibuprofen (ADVIL) 200 MG tablet Take 3 tables by mouth every 6 hours as needed for pain with food. 30 tablet 0  . lamoTRIgine (LAMICTAL) 100 MG tablet Take 100 mg by mouth daily.  0  . levothyroxine (SYNTHROID, LEVOTHROID) 125 MCG tablet Take 125 mcg by mouth daily before breakfast.    . divalproex (DEPAKOTE ER) 500 MG 24 hr tablet Take 1,500 mg by mouth daily.    . famotidine (PEPCID) 20 MG tablet Take 1 tablet (20 mg total) by mouth 2 (two) times daily.     No current facility-administered medications for this visit.    Allergies:   Penicillin g potassium in d5w    Social History:  The patient  reports that he quit smoking about 10 years ago. His smoking use included Cigarettes. He has a 30 pack-year smoking history. He has never used smokeless tobacco. He reports that he drinks alcohol. He reports that he does not use illicit drugs.   Family History:  The patient's family history includes Alzheimer's disease in his father; CAD in his father; Cancer in his mother; Heart attack in his maternal grandfather and paternal uncle; Other in his sister; Parkinson's disease in his father.    ROS:   Please see  the history of present illness.   Review of Systems  All other systems reviewed and are negative.     PHYSICAL EXAM: VS:  BP 102/64 mmHg  Pulse 72  Ht 5\' 10"  (1.778 m)  Wt 286 lb (129.729 kg)  BMI 41.04 kg/m2  SpO2 97%    Wt Readings from Last 3 Encounters:  04/18/15 286 lb (129.729 kg)  12/05/14 289 lb (131.09 kg)  11/30/14 288 lb 4.8 oz (130.772 kg)     GEN: Well nourished, well developed, in no acute distress HEENT: normal Neck: no JVD,  no masses Cardiac:  Normal S1/S2, RRR; no murmur ,  no rubs or gallops, no edema   Respiratory:  clear to auscultation bilaterally, no wheezing, rhonchi or rales. GI: soft, nontender,  nondistended, + BS MS: no deformity or atrophy Skin: warm and dry  Neuro:  CNs II-XII intact, Strength and sensation are intact Psych: Normal affect   EKG:  EKG is ordered today.  It demonstrates:   NSR, HR 72, normal axis, T-wave inversion in lead 3/nonspecific ST-T wave changes, QTC 409 ms   Recent Labs: 11/28/2014: BUN 18; Creatinine, Ser 1.16; Hemoglobin 13.7; Platelets 183; Potassium 4.1; Sodium 141; TSH 0.259* 01/31/2015: ALT 30    Lipid Panel    Component Value Date/Time   CHOL 219* 11/29/2014 0056   TRIG 189* 11/29/2014 0056   HDL 39* 11/29/2014 0056   CHOLHDL 5.6 11/29/2014 0056   VLDL 38 11/29/2014 0056   LDLCALC 142* 11/29/2014 0056      ASSESSMENT AND PLAN:  1. Chest Pain: Symptoms are atypical. On exam, I can clearly reproduce pain by palpation over his left chest. This appears to be musculoskeletal chest pain. Cardiac catheterization in May demonstrated mild nonobstructive disease. His oxygen saturation is normal. He is not tachycardic. Well's criteria are negative.  -  Treat with ibuprofen 800 mg 3 times a day 1 week  -  Pepcid 20 mg twice a day 1 week for GI protection  -  I suggested that he restrict his lifting for 2 weeks but he declined having a note restricting him at work.  -  Consider FU with PCP to workup other causes for CP.   2. CAD: As noted, he had mild CAD at cardiac catheterization in May. Continue risk factor modification. Continue aspirin, statin.  3. Hyperlipidemia: Continue statin.     Medication Changes: Current medicines are reviewed at length with the patient today.  Concerns regarding medicines are as outlined above.  The following changes have been made:   Discontinued Medications   No medications on file   Modified Medications   No medications on file   New Prescriptions   FAMOTIDINE (PEPCID) 20 MG TABLET    Take 1 tablet (20 mg total) by mouth 2 (two) times daily.   Labs/ tests ordered today include:   Orders Placed This  Encounter  Procedures  . EKG 12-Lead     Disposition:    FU with Dr. Dani Gobble Croitoru as planned.      Signed, Versie Starks, MHS 04/18/2015 1:48 PM    [facility] [address], [place], [place]  [address] Phone: [phone]; Fax: [phone]

## 2015-05-24 ENCOUNTER — Ambulatory Visit: Payer: Self-pay | Admitting: Cardiovascular Disease

## 2015-12-20 ENCOUNTER — Other Ambulatory Visit: Payer: Self-pay | Admitting: Physician Assistant

## 2015-12-20 ENCOUNTER — Ambulatory Visit (INDEPENDENT_AMBULATORY_CARE_PROVIDER_SITE_OTHER): Payer: Self-pay

## 2015-12-20 ENCOUNTER — Other Ambulatory Visit: Payer: Self-pay | Admitting: Adult Health

## 2015-12-20 DIAGNOSIS — S0083XA Contusion of other part of head, initial encounter: Secondary | ICD-10-CM

## 2015-12-20 DIAGNOSIS — W19XXXA Unspecified fall, initial encounter: Secondary | ICD-10-CM

## 2015-12-20 NOTE — Telephone Encounter (Signed)
Rx(s) sent to pharmacy electronically.  

## 2016-01-06 ENCOUNTER — Telehealth: Payer: Self-pay | Admitting: Cardiovascular Disease

## 2016-01-06 NOTE — Telephone Encounter (Signed)
Returned call. 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 She recommended head CT to r/o intracranial injury, etc. Noted head CT neg. Per his understanding, she stated she would order a stress test and have him f/u w cardiology.  Patient called today to schedule, but was confused as he thought he was scheduling a stress test - was set up for OV w Almyra Deforest Realizing he wasn't set up for stress test yet, he verbalized some frustration.  Patient has already been out of work for 3 weeks and is trying to get cleared. I explained that there were no orders in system for this that I could see, apologized for the miscommunication. Verified that I didn't see any orders placed by the NP w/ exception of the head CT. Operator was able to schedule him as soon as available for appt w/ Isaac Laud on Wed 11:30am.  Explained that Dr. Sallyanne Kuster may ok an order for stress test & we could placed order & schedule, but he likely will benefit from office visit for cardiology assessment.  Pt plans to come Wednesday for OV and proceed from there. Aware Isaac Laud can order necessary testing and note if any additional testing recommended. Patient agreeable & voiced thanks.

## 2016-01-06 NOTE — Telephone Encounter (Signed)
New message  Pt is calling because he verbalized that the appt with MENG is suspose to be a stress test to clear him   Pt stated that he fainted and the PA asked that he take a stress test, he is also wanting the stress test to be filed under Workers comp  Please call pt

## 2016-01-07 NOTE — Telephone Encounter (Signed)
Please schedule him for a treadmill ECG stress test (and anything else you may think appropriate)

## 2016-01-08 ENCOUNTER — Encounter: Payer: Self-pay | Admitting: Physician Assistant

## 2016-01-08 ENCOUNTER — Ambulatory Visit (INDEPENDENT_AMBULATORY_CARE_PROVIDER_SITE_OTHER): Payer: 59 | Admitting: Physician Assistant

## 2016-01-08 VITALS — BP 110/60 | HR 73 | Ht 70.0 in | Wt 289.8 lb

## 2016-01-08 DIAGNOSIS — Z79899 Other long term (current) drug therapy: Secondary | ICD-10-CM

## 2016-01-08 DIAGNOSIS — E785 Hyperlipidemia, unspecified: Secondary | ICD-10-CM

## 2016-01-08 DIAGNOSIS — I2510 Atherosclerotic heart disease of native coronary artery without angina pectoris: Secondary | ICD-10-CM

## 2016-01-08 DIAGNOSIS — R55 Syncope and collapse: Secondary | ICD-10-CM

## 2016-01-08 NOTE — Progress Notes (Signed)
Cardiology Office Note    Date:  01/08/2016   ID:  Jerome Rogers, DOB Apr 21, 1966, MRN [id number]  PCP:  Orpah Melter, MD  Cardiologist:  Dr. Sallyanne Kuster  Chief Complaint  Patient presents with  . Follow-up    seen for Dr. Sallyanne Kuster    History of Present Illness:  Jerome Rogers is a 50 y.o. male with PMH of hypothyroidism, anxiety, former smoker, JC virus induced polyoma nephropathy, and nonobstructive CAD. Patient was previously admitted in May 2016 with chest pain and syncope with exertion. Cath at that time showed mild nonobstructive CAD. He was diagnosed with musculoskeletal chest pain. He was last seen in the office in October 2016 at which time he continued to have reproducible chest pain on palpation. He was treated with ibuprofen along with Pepcid for GI protection.  He presents today for cardiac follow-up. 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 He had significant periorbital hematoma afterward. CT of the head was negative. He presented for cardiology evaluation and ETT. He required ETT before he can back to working. He says he never had passing out spells before or after the event. He denies any dizziness. He denies any chest discomfort or shortness of breath. The circumstances surrounding the event was unclear, it is unclear if he was hypoglycemic or was the event due to dizziness associated with tachypnea. He has not been able to go back to work since that time. He denies any chest discomfort or exertional symptoms since the event. His symptom does not sound cardiac in nature. Given isolated incident, I doubt 30 day event monitor would be any benefit at this time. His EKG showed no acute changes. He denies recent chest discomfort. We'll obtain outpatient ETT, if normal, then would not expect any further cardiac workup.    Past Medical History    Diagnosis Date  . Hypothyroidism   . Anxiety   . 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 Morbid obesity ([place])   . CAD (coronary artery disease)     cath 11/29/14    Prox LAD lesion, 20% stenosed.    Past Surgical History  Procedure Laterality Date  . Appendectomy    . Varicose vein stripping    . Cardiac catheterization N/A 11/29/2014    Procedure: Left Heart Cath and Coronary Angiography;  Surgeon: Troy Sine, MD;  Location: [facility];  Service: Cardiovascular;  Laterality: N/A;    Current Medications: Outpatient Prescriptions Prior to Visit  Medication Sig Dispense Refill  . aspirin EC 81 MG EC tablet Take 1 tablet (81 mg total) by mouth daily.    Marland Kitchen atorvastatin (LIPITOR) 40 MG tablet Take 1 tablet (40 mg total) by mouth daily at 6 PM. PLEASE CONTACT OFFICE FOR ADDITIONAL REFILLS 30 tablet 1  . divalproex (DEPAKOTE ER) 500 MG 24 hr tablet Take 1,500 mg by mouth daily.    Marland Kitchen lamoTRIgine (LAMICTAL) 100 MG tablet Take 100 mg by mouth daily.  0  . levothyroxine (SYNTHROID, LEVOTHROID) 125 MCG tablet Take 125 mcg by mouth daily before breakfast.    . famotidine (PEPCID) 20 MG tablet Take 1 tablet (20 mg total) by mouth 2 (two) times daily.    Marland Kitchen ibuprofen (ADVIL) 200 MG tablet Take 3 tables by mouth every 6 hours as needed for pain with food. (Patient not taking: Reported  on 01/08/2016) 30 tablet 0   No facility-administered medications prior to visit.     Allergies:   Penicillin g potassium in d5w   Social History   Social History  . Marital Status: Single    Spouse Name: N/A  . Number of Children: N/A  . Years of Education: N/A   Social History Main Topics  . Smoking status: Former Smoker -- 1.00 packs/day for 30 years    Types: Cigarettes    Quit date: 02/15/2005  . Smokeless tobacco: Never Used  . Alcohol Use: 0.0 oz/week    0 Standard drinks or equivalent per week     Comment: social - not everyday.  . Drug Use: No  . Sexual Activity: Not Asked    Other Topics Concern  . None   Social History Narrative   Lives with Foss, dtr (60), and son (31).  Works for [employer] and volunteers @ others.     Family History:  The patient's family history includes Alzheimer's disease in his father; CAD in his father; Cancer in his mother; Cancer (age of onset: 41) in his sister; Heart attack in his maternal grandfather and paternal uncle; Other in his sister; Parkinson's disease in his father.   ROS:   Please see the history of present illness.    ROS All other systems reviewed and are negative.   PHYSICAL EXAM:   VS:  BP 110/60 mmHg  Pulse 73  Ht 5\' 10"  (1.778 m)  Wt 289 lb 12.8 oz (131.452 kg)  BMI 41.58 kg/m2   GEN: Well nourished, well developed, in no acute distress HEENT: normal Neck: no JVD, carotid bruits, or masses Cardiac: RRR; no murmurs, rubs, or gallops,no edema  Respiratory:  clear to auscultation bilaterally, normal work of breathing GI: soft, nontender, nondistended, + BS MS: no deformity or atrophy Skin: warm and dry, no rash Neuro:  Alert and Oriented x 3, Strength and sensation are intact Psych: euthymic mood, full affect  Wt Readings from Last 3 Encounters:  01/08/16 289 lb 12.8 oz (131.452 kg)  04/18/15 286 lb (129.729 kg)  12/05/14 289 lb (131.09 kg)      Studies/Labs Reviewed:   EKG:  EKG is ordered today.  The ekg ordered today demonstrates NSR without any acute ST-T wave changes  Recent Labs: 01/31/2015: ALT 30   Lipid Panel    Component Value Date/Time   CHOL 219* 11/29/2014 0056   TRIG 189* 11/29/2014 0056   HDL 39* 11/29/2014 0056   CHOLHDL 5.6 11/29/2014 0056   VLDL 38 11/29/2014 0056   LDLCALC 142* 11/29/2014 0056    Additional studies/ records that were reviewed today include:   LHC 11/29/14 LAD: prox 20%, mid 20% LCx: prox 20%, mid 30% RCA: Mid 10% Mild nonobstructive coronary artery disease. Low normal LV function with an ejection fraction in the 50-55% range.   CT of  head w/o contrast 12/20/2015  IMPRESSION: Contusion over the right side of the frontal bone without underlying fracture or acute intracranial abnormality.  Left mastoid effusion. Mucosal thickening right sphenoid sinus also noted.      ASSESSMENT:    1. Syncope, unspecified syncope type   2. Coronary artery disease involving native coronary artery of native heart without angina pectoris   3. Hyperlipidemia   4. Medication management   5. Dyslipidemia   6. Obesity, Class III, BMI 40-49.9 (morbid obesity) ([place])      PLAN:  In order of problems listed above:  1. Syncope with  periorbital hematoma due to subsequent fall  - CT of head 12/20/2015 contusion over R side of frontal bone with fracture or acute intracranial abnormality.   - doubt cardiac etiology, plan for outpatient ETT, if negative, no further cardiac workup  - if he has severe carotid artery stenosis, I would expect his symptom is a lot more frequent, will hold off on obtain carotid U/S at this time unless recurrence.   2. Nonobstructive CAD: last cath 2016 shows mild nonobstructive CAD  3. Hypothyroidism: on synthroid    Medication Adjustments/Labs and Tests Ordered: Current medicines are reviewed at length with the patient today.  Concerns regarding medicines are outlined above.  Medication changes, Labs and Tests ordered today are listed in the Patient Instructions below. Patient Instructions  Medication Instructions:  None  Labwork: None  Testing/Procedures: Your physician has requested that you have an exercise tolerance test today or tomorrow at hospital preferably.  Can do tomorrow in our office if treadmill fixed. For further information please visit [email or web address]. Please also follow instruction sheet, as given.   Follow-Up: Your physician wants you to follow-up in: 6 months with Dr. Sallyanne Kuster.  You will receive a reminder letter in the mail two months in advance. If you don't receive a letter,  please call our office to schedule the follow-up appointment.   Any Other Special Instructions Will Be Listed Below (If Applicable).     If you need a refill on your cardiac medications before your next appointment, please call your pharmacy.       Hilbert Corrigan, [place]  01/08/2016 12:49 PM    [facility] [place], [place], [place]  [address] Phone: [phone]; Fax: [phone]

## 2016-01-08 NOTE — Patient Instructions (Signed)
Medication Instructions:  None  Labwork: None  Testing/Procedures: Your physician has requested that you have an exercise tolerance test today or tomorrow at hospital preferably.  Can do tomorrow in our office if treadmill fixed. For further information please visit [email or web address]. Please also follow instruction sheet, as given.   Follow-Up: Your physician wants you to follow-up in: 6 months with Dr. Sallyanne Kuster.  You will receive a reminder letter in the mail two months in advance. If you don't receive a letter, please call our office to schedule the follow-up appointment.   Any Other Special Instructions Will Be Listed Below (If Applicable).     If you need a refill on your cardiac medications before your next appointment, please call your pharmacy.

## 2016-01-09 ENCOUNTER — Ambulatory Visit (HOSPITAL_COMMUNITY)
Admission: RE | Admit: 2016-01-09 | Discharge: 2016-01-09 | Disposition: A | Discharge status: Home / Self Care | Payer: 59 | Source: Ambulatory Visit | Attending: Physician Assistant | Admitting: Physician Assistant

## 2016-01-09 ENCOUNTER — Encounter: Payer: Self-pay | Admitting: Cardiology

## 2016-01-09 DIAGNOSIS — R55 Syncope and collapse: Secondary | ICD-10-CM | POA: Diagnosis present

## 2016-01-09 LAB — EXERCISE TOLERANCE TEST
CHL CUP RESTING HR STRESS: 83 {beats}/min
CSEPED: 10 min
CSEPHR: 89 %
CSEPPHR: 151 {beats}/min
Estimated workload: 10.1 METS
Exercise duration (sec): 0 s
MPHR: 169 {beats}/min

## 2016-01-28 ENCOUNTER — Encounter: Payer: Self-pay | Admitting: *Deleted

## 2016-02-16 ENCOUNTER — Other Ambulatory Visit: Payer: Self-pay | Admitting: Cardiovascular Disease

## 2016-09-16 DIAGNOSIS — M9901 Segmental and somatic dysfunction of cervical region: Secondary | ICD-10-CM | POA: Diagnosis not present

## 2016-09-16 DIAGNOSIS — M5411 Radiculopathy, occipito-atlanto-axial region: Secondary | ICD-10-CM | POA: Diagnosis not present

## 2016-09-22 DIAGNOSIS — M9901 Segmental and somatic dysfunction of cervical region: Secondary | ICD-10-CM | POA: Diagnosis not present

## 2016-09-22 DIAGNOSIS — M5411 Radiculopathy, occipito-atlanto-axial region: Secondary | ICD-10-CM | POA: Diagnosis not present

## 2016-09-24 DIAGNOSIS — M5411 Radiculopathy, occipito-atlanto-axial region: Secondary | ICD-10-CM | POA: Diagnosis not present

## 2016-09-24 DIAGNOSIS — M9901 Segmental and somatic dysfunction of cervical region: Secondary | ICD-10-CM | POA: Diagnosis not present

## 2016-09-30 DIAGNOSIS — M5411 Radiculopathy, occipito-atlanto-axial region: Secondary | ICD-10-CM | POA: Diagnosis not present

## 2016-09-30 DIAGNOSIS — M9901 Segmental and somatic dysfunction of cervical region: Secondary | ICD-10-CM | POA: Diagnosis not present

## 2016-10-11 IMAGING — CT CT HEAD W/O CM
3 series · 15 of 47 positions shown, 18 images · non-contrast
Comparison: None.

CLINICAL DATA: Syncope and fall today with a blow to the forehead.
Initial encounter.

EXAM:
CT HEAD WITHOUT CONTRAST
TECHNIQUE: Contiguous axial images were obtained from the base of the skull
through the vertex without intravenous contrast.

[Series 2: head wo · axial · 0.44mm/px · z∈[-193,-58]mm · 9 of 33 slices shown, 12 images]
[im 3/33  brain]
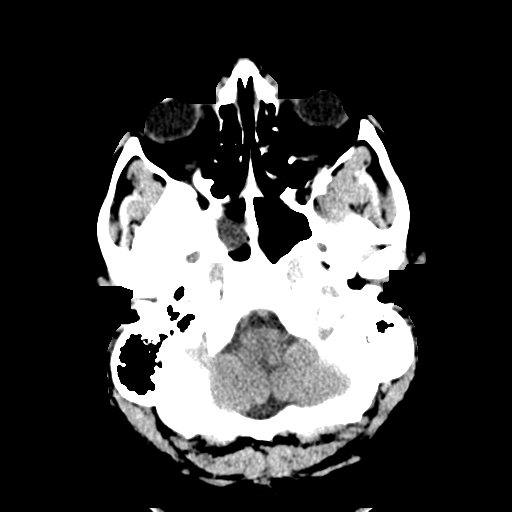
[im 3/33  bone]
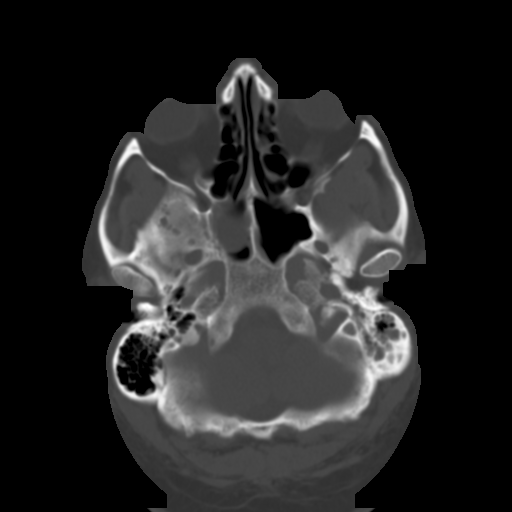
[im 6/33  brain]
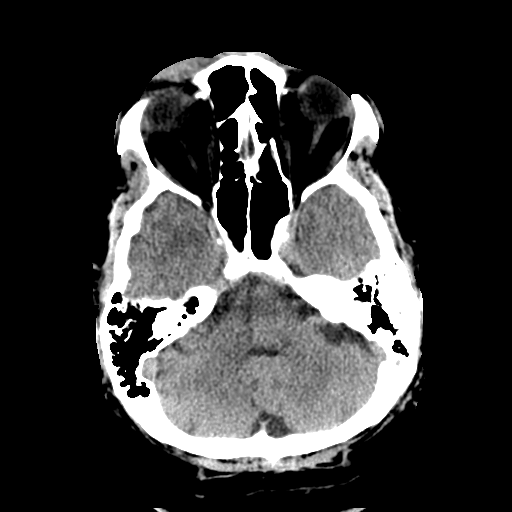
[im 9/33  brain]
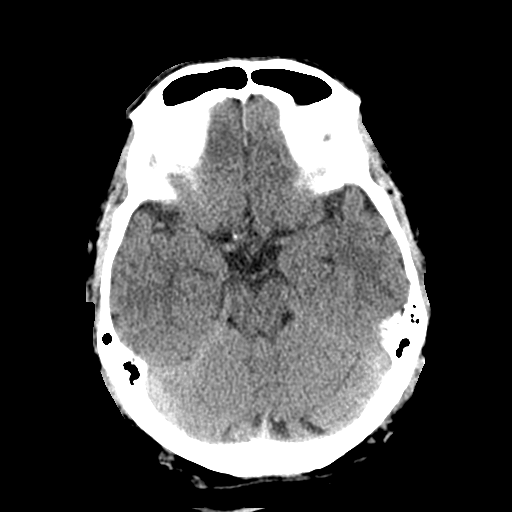
[im 13/33  brain]
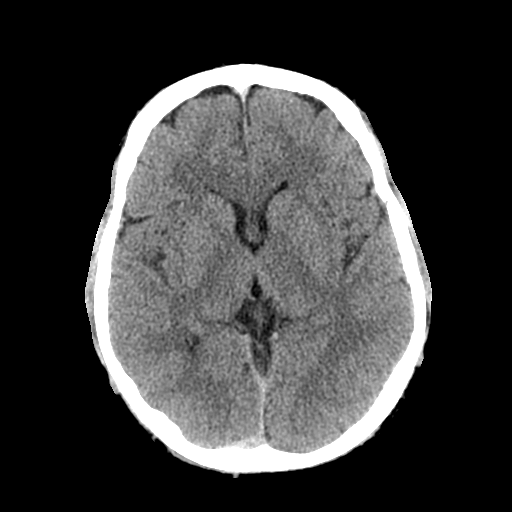
[im 17/33  brain]
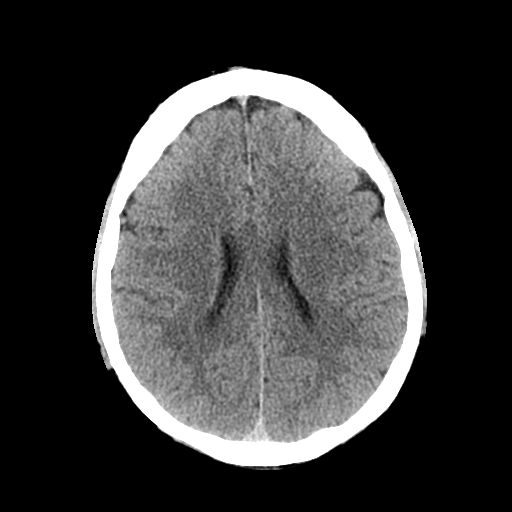
[im 17/33  bone]
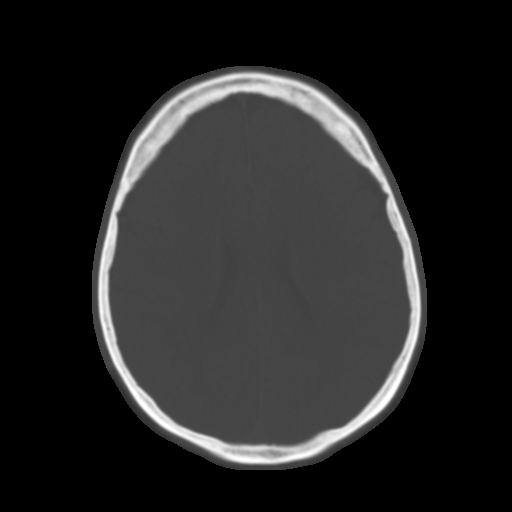
[im 20/33  brain]
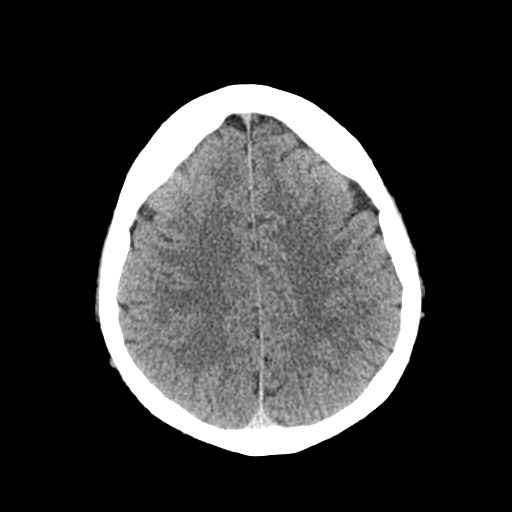
[im 24/33  brain]
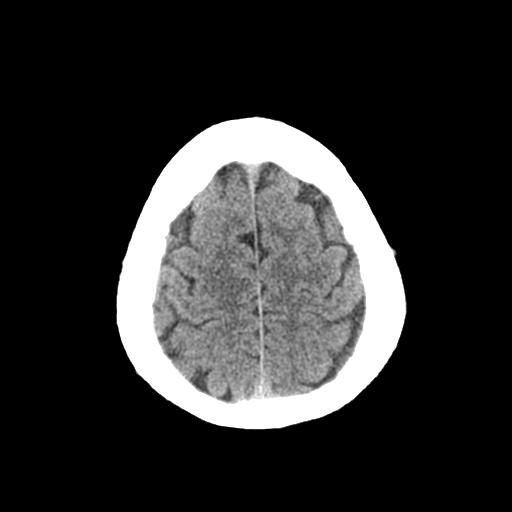
[im 27/33  brain]
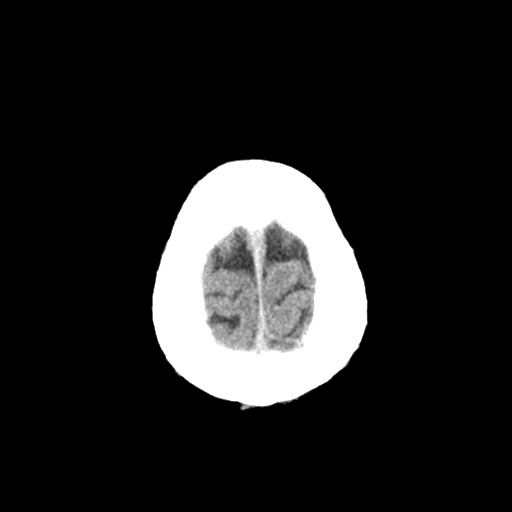
[im 30/33  brain]
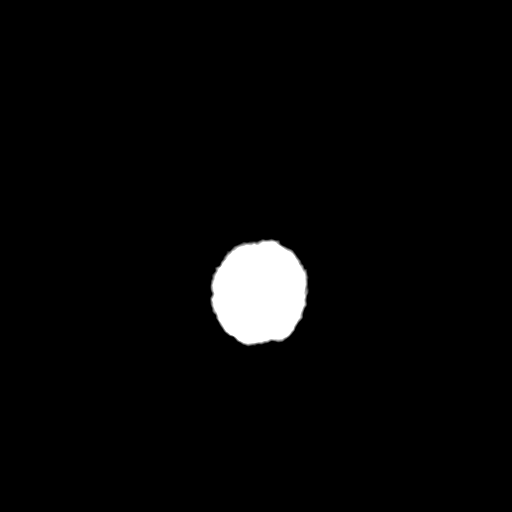
[im 30/33  bone]
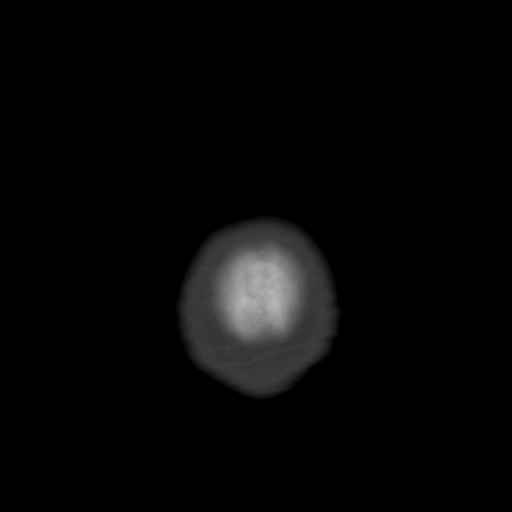

[Series 4: head wo coronal · coronal · 0.34mm/px · 3 of 69 slices shown]
[im 23/69  brain]
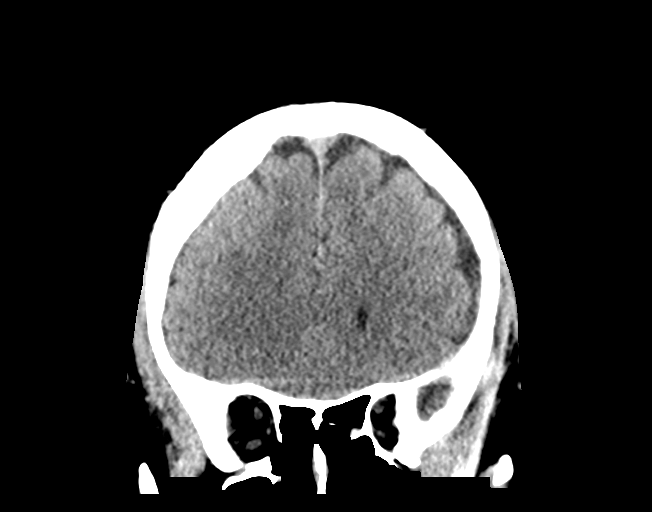
[im 31/69  brain]
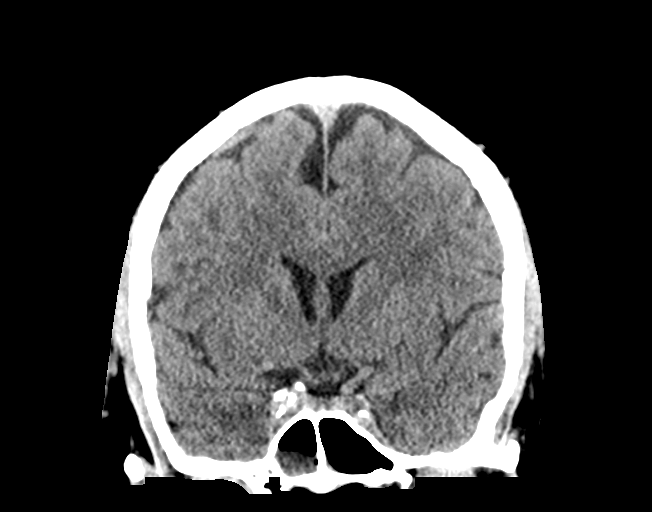
[im 38/69  brain]
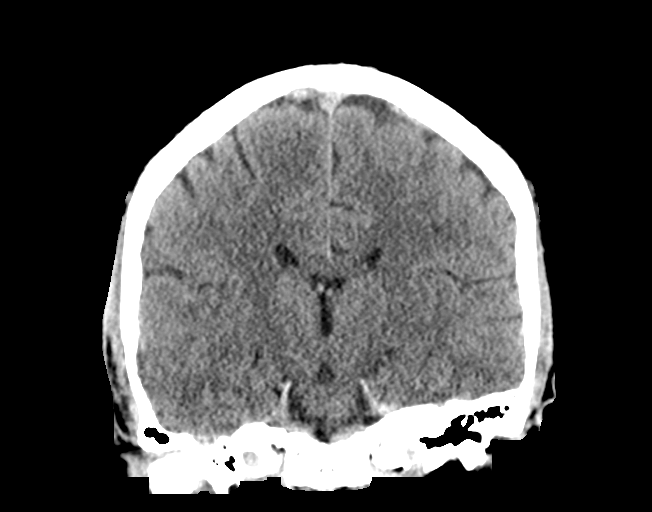

[Series 5: head wo sagittal · sagittal · 0.33mm/px · 3 of 57 slices shown]
[im 19/57  brain]
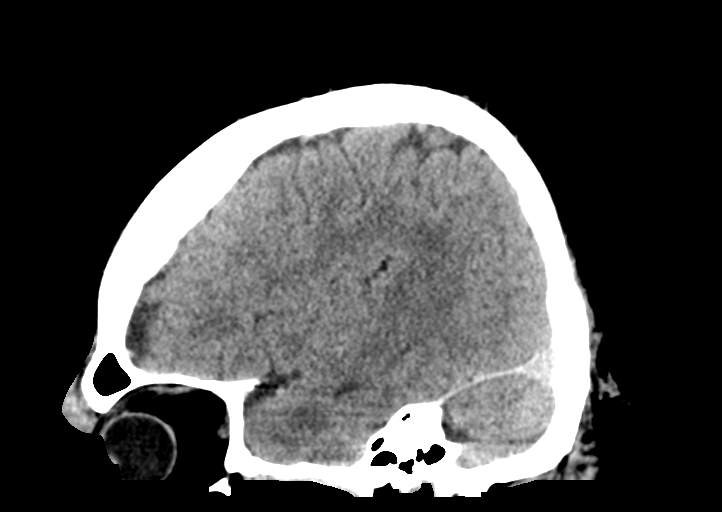
[im 29/57  brain]
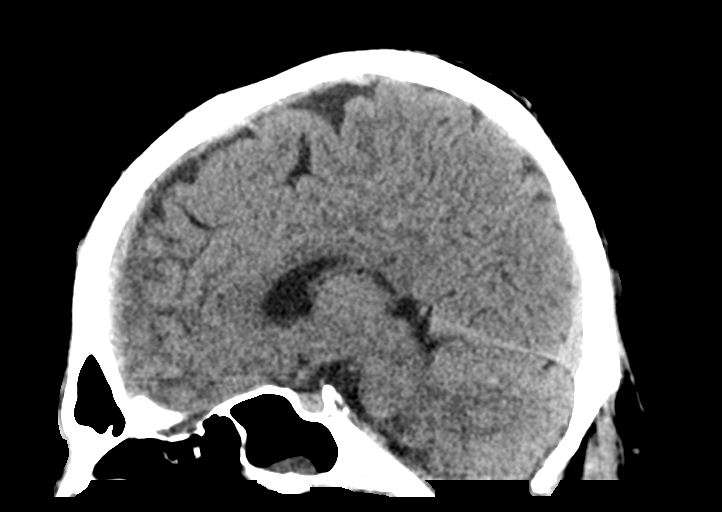
[im 38/57  brain]
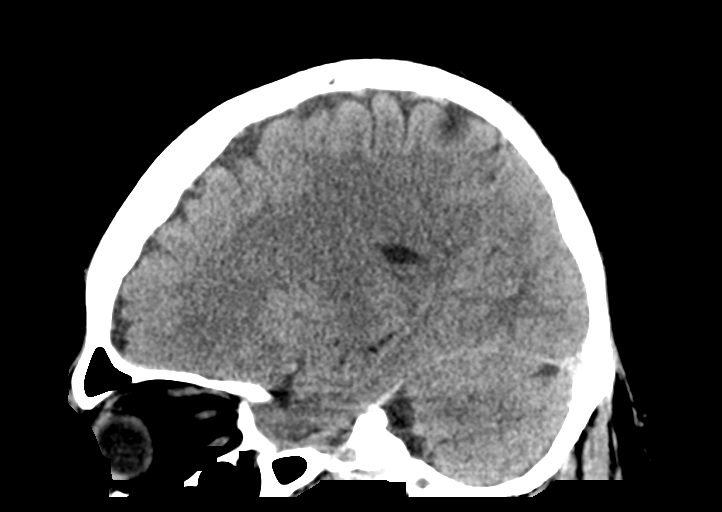

[15 of 47 positions shown; findings below may reference images not displayed]

FINDINGS: Soft tissue contusion is seen over the frontal bone on the right. No
underlying fracture is identified. The brain appears normal without
hemorrhage, infarct, mass lesion, mass effect, midline shift or
abnormal extra-axial fluid collection. No hydrocephalus or
pneumocephalus. The calvarium is intact. Mucosal thickening is seen
in the right sphenoid sinus. Left mastoid effusion is noted.
IMPRESSION: Contusion over the right side of the frontal bone without underlying
fracture or acute intracranial abnormality.

Left mastoid effusion. Mucosal thickening right sphenoid sinus also
noted.

## 2017-02-26 DIAGNOSIS — R1310 Dysphagia, unspecified: Secondary | ICD-10-CM | POA: Diagnosis not present

## 2017-02-26 DIAGNOSIS — Z1211 Encounter for screening for malignant neoplasm of colon: Secondary | ICD-10-CM | POA: Diagnosis not present

## 2017-03-22 DIAGNOSIS — K210 Gastro-esophageal reflux disease with esophagitis: Secondary | ICD-10-CM | POA: Diagnosis not present

## 2017-03-22 DIAGNOSIS — D120 Benign neoplasm of cecum: Secondary | ICD-10-CM | POA: Diagnosis not present

## 2017-03-22 DIAGNOSIS — D127 Benign neoplasm of rectosigmoid junction: Secondary | ICD-10-CM | POA: Diagnosis not present

## 2017-03-22 DIAGNOSIS — Z1211 Encounter for screening for malignant neoplasm of colon: Secondary | ICD-10-CM | POA: Diagnosis not present

## 2017-03-22 DIAGNOSIS — R1310 Dysphagia, unspecified: Secondary | ICD-10-CM | POA: Diagnosis not present

## 2017-05-05 DIAGNOSIS — B356 Tinea cruris: Secondary | ICD-10-CM | POA: Diagnosis not present

## 2017-07-12 DIAGNOSIS — J069 Acute upper respiratory infection, unspecified: Secondary | ICD-10-CM | POA: Diagnosis not present

## 2017-08-27 DIAGNOSIS — M1711 Unilateral primary osteoarthritis, right knee: Secondary | ICD-10-CM | POA: Diagnosis not present

## 2018-01-26 DIAGNOSIS — E039 Hypothyroidism, unspecified: Secondary | ICD-10-CM | POA: Diagnosis not present

## 2018-03-17 DIAGNOSIS — E039 Hypothyroidism, unspecified: Secondary | ICD-10-CM | POA: Diagnosis not present

## 2018-03-31 DIAGNOSIS — R030 Elevated blood-pressure reading, without diagnosis of hypertension: Secondary | ICD-10-CM | POA: Diagnosis not present

## 2018-11-08 DIAGNOSIS — M25562 Pain in left knee: Secondary | ICD-10-CM | POA: Diagnosis not present

## 2018-11-23 DIAGNOSIS — M25562 Pain in left knee: Secondary | ICD-10-CM | POA: Diagnosis not present

## 2018-12-02 ENCOUNTER — Emergency Department (HOSPITAL_COMMUNITY)
Admission: EM | Admit: 2018-12-02 | Discharge: 2018-12-02 | Disposition: A | Discharge status: Home / Self Care | Payer: 59 | Attending: Emergency Medicine | Admitting: Emergency Medicine

## 2018-12-02 ENCOUNTER — Emergency Department (HOSPITAL_COMMUNITY): Payer: 59

## 2018-12-02 ENCOUNTER — Other Ambulatory Visit: Payer: Self-pay

## 2018-12-02 ENCOUNTER — Encounter (HOSPITAL_COMMUNITY): Payer: Self-pay | Admitting: Emergency Medicine

## 2018-12-02 DIAGNOSIS — N201 Calculus of ureter: Secondary | ICD-10-CM | POA: Diagnosis not present

## 2018-12-02 DIAGNOSIS — R001 Bradycardia, unspecified: Secondary | ICD-10-CM | POA: Insufficient documentation

## 2018-12-02 DIAGNOSIS — Z79899 Other long term (current) drug therapy: Secondary | ICD-10-CM | POA: Insufficient documentation

## 2018-12-02 DIAGNOSIS — Z87891 Personal history of nicotine dependence: Secondary | ICD-10-CM | POA: Diagnosis not present

## 2018-12-02 DIAGNOSIS — I2510 Atherosclerotic heart disease of native coronary artery without angina pectoris: Secondary | ICD-10-CM | POA: Insufficient documentation

## 2018-12-02 DIAGNOSIS — E039 Hypothyroidism, unspecified: Secondary | ICD-10-CM | POA: Insufficient documentation

## 2018-12-02 DIAGNOSIS — Z7982 Long term (current) use of aspirin: Secondary | ICD-10-CM | POA: Insufficient documentation

## 2018-12-02 DIAGNOSIS — N179 Acute kidney failure, unspecified: Secondary | ICD-10-CM | POA: Diagnosis not present

## 2018-12-02 DIAGNOSIS — R109 Unspecified abdominal pain: Secondary | ICD-10-CM | POA: Diagnosis present

## 2018-12-02 LAB — CBC WITH DIFFERENTIAL/PLATELET
Abs Immature Granulocytes: 0.07 10*3/uL (ref 0.00–0.07)
Basophils Absolute: 0 10*3/uL (ref 0.0–0.1)
Basophils Relative: 1 %
Eosinophils Absolute: 0.2 10*3/uL (ref 0.0–0.5)
Eosinophils Relative: 3 %
HCT: 40.4 % (ref 39.0–52.0)
Hemoglobin: 13.7 g/dL (ref 13.0–17.0)
Immature Granulocytes: 1 %
Lymphocytes Relative: 17 %
Lymphs Abs: 1.3 10*3/uL (ref 0.7–4.0)
MCH: 32.1 pg (ref 26.0–34.0)
MCHC: 33.9 g/dL (ref 30.0–36.0)
MCV: 94.6 fL (ref 80.0–100.0)
Monocytes Absolute: 0.8 10*3/uL (ref 0.1–1.0)
Monocytes Relative: 11 %
Neutro Abs: 5.3 10*3/uL (ref 1.7–7.7)
Neutrophils Relative %: 67 %
Platelets: 187 10*3/uL (ref 150–400)
RBC: 4.27 MIL/uL (ref 4.22–5.81)
RDW: 12.8 % (ref 11.5–15.5)
WBC: 7.8 10*3/uL (ref 4.0–10.5)
nRBC: 0 % (ref 0.0–0.2)

## 2018-12-02 LAB — URINALYSIS, ROUTINE W REFLEX MICROSCOPIC
Bacteria, UA: NONE SEEN
Bilirubin Urine: NEGATIVE
Glucose, UA: NEGATIVE mg/dL
Ketones, ur: NEGATIVE mg/dL
Leukocytes,Ua: NEGATIVE
Nitrite: NEGATIVE
Protein, ur: 30 mg/dL — AB
Specific Gravity, Urine: 1.028 (ref 1.005–1.030)
pH: 5 (ref 5.0–8.0)

## 2018-12-02 LAB — COMPREHENSIVE METABOLIC PANEL
ALT: 34 U/L (ref 0–44)
AST: 25 U/L (ref 15–41)
Albumin: 4.2 g/dL (ref 3.5–5.0)
Alkaline Phosphatase: 53 U/L (ref 38–126)
Anion gap: 8 (ref 5–15)
BUN: 26 mg/dL — ABNORMAL HIGH (ref 6–20)
CO2: 19 mmol/L — ABNORMAL LOW (ref 22–32)
Calcium: 9 mg/dL (ref 8.9–10.3)
Chloride: 110 mmol/L (ref 98–111)
Creatinine, Ser: 1.29 mg/dL — ABNORMAL HIGH (ref 0.61–1.24)
GFR calc Af Amer: >60 mL/min (ref >60)
GFR calc non Af Amer: >60 mL/min (ref >60)
Glucose, Bld: 131 mg/dL — ABNORMAL HIGH (ref 70–99)
Potassium: 4.1 mmol/L (ref 3.5–5.1)
Sodium: 137 mmol/L (ref 135–145)
Total Bilirubin: 1 mg/dL (ref 0.3–1.2)
Total Protein: 6.4 g/dL — ABNORMAL LOW (ref 6.5–8.1)

## 2018-12-02 MED ORDER — SODIUM CHLORIDE 0.9 % IV BOLUS: 500.0000 mL | Freq: Once | INTRAVENOUS | Status: DC

## 2018-12-02 MED ORDER — OXYCODONE-ACETAMINOPHEN 5-325 MG PO TABS
1.0000 | ORAL_TABLET | Freq: Once | ORAL | Status: AC
  Administered 2018-12-02: 1 via ORAL
  Filled 2018-12-02: qty 1

## 2018-12-02 MED ORDER — HYDROMORPHONE HCL 1 MG/ML IJ SOLN
1.0000 mg | Freq: Once | INTRAMUSCULAR | Status: AC
  Administered 2018-12-02: 1 mg via INTRAVENOUS
  Filled 2018-12-02: qty 1

## 2018-12-02 MED ORDER — ONDANSETRON 4 MG PO TBDP: 4.0000 mg | ORAL_TABLET | Freq: Three times a day (TID) | ORAL | 0 refills | Status: AC | PRN

## 2018-12-02 MED ORDER — OXYCODONE-ACETAMINOPHEN 5-325 MG PO TABS: 1.0000 | ORAL_TABLET | Freq: Four times a day (QID) | ORAL | 0 refills | Status: DC | PRN

## 2018-12-02 MED ORDER — TAMSULOSIN HCL 0.4 MG PO CAPS: 0.4000 mg | ORAL_CAPSULE | Freq: Every day | ORAL | 0 refills | Status: AC

## 2018-12-02 MED ORDER — SODIUM CHLORIDE 0.9 % IV BOLUS
1000.0000 mL | Freq: Once | INTRAVENOUS | Status: AC
  Administered 2018-12-02: 1000 mL via INTRAVENOUS

## 2018-12-02 NOTE — ED Provider Notes (Signed)
[facility] EMERGENCY DEPARTMENT Provider Note   CSN: [id number] Arrival date & time: 12/02/18  7510    History   Chief Complaint Chief Complaint  Patient presents with   Flank Pain    HPI Jerome Rogers is a 53 y.o. male.     HPI   Patient is a 53 year old male with past medical history of anxiety, CAD, hypothyroidism, [name] polyoma nephropathy, obesity presenting for right flank and right lower quadrant pain.  Patient reports that it began suddenly while he was driving to work this morning.  Reports that the pain was sharp and colicky in nature.  He reports he has a history remotely of a kidney stone but this felt much worse.  Patient reports he vomited 1 time due to the pain.  He denies any chest pain or shortness of breath, syncope, diaphoresis.  He denies any dysuria, urgency, or frequency.  He denies noting any hematuria.  No fever or chills.  Denies abdominal surgical history.  Patient is passing gas. Patient is a non-smoker. No family history of aortic aneurysm.   Patient was also noted by EMS to be bradycardic.  He denies any chest pain, shortness of breath, dizziness or lightheadedness or syncope.  He does not know if he has been bradycardic in the past.  Past Medical History:  Diagnosis Date   Anxiety    CAD (coronary artery disease)    cath 11/29/14    Prox LAD lesion, 20% stenosed.   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    Morbid obesity [facility])     Patient Active Problem List   Diagnosis Date Noted   Coronary artery disease 12/05/2014   Dyslipidemia 12/05/2014   Obesity, Class III, BMI 40-49.9 (morbid obesity) ([place]) 12/05/2014   Chest pain    Hypothyroidism 08/18/2013    Past Surgical History:  Procedure Laterality Date   APPENDECTOMY     CARDIAC CATHETERIZATION N/A 11/29/2014   Procedure: Left Heart Cath and Coronary Angiography;  Surgeon: Troy Sine, MD;  Location: [place] [facility];   Service: Cardiovascular;  Laterality: N/A;   Varicose vein stripping          Home Medications    Prior to Admission medications   Medication Sig Start Date End Date Taking? Authorizing Provider  aspirin EC 81 MG EC tablet Take 1 tablet (81 mg total) by mouth daily. 11/30/14  Yes Bhagat, Bhavinkumar, PA  levothyroxine (SYNTHROID, LEVOTHROID) 125 MCG tablet Take 250 mcg by mouth daily before breakfast.    Yes [provider]  meloxicam (MOBIC) 7.5 MG tablet Take 7.5 mg by mouth 2 (two) times daily. 11/23/18  Yes [provider]  atorvastatin (LIPITOR) 40 MG tablet Take 1 tablet (40 mg total) by mouth daily at 6 PM. PLEASE CONTACT OFFICE FOR ADDITIONAL REFILLS Patient not taking: Reported on 12/02/2018 02/17/16   Croitoru, Dani Gobble, MD    Family History Family History  Problem Relation Age of Onset   Cancer Mother        alive and well.   Alzheimer's disease Father    CAD Father        cabg and redo cabg - began in late 60's.  died @ 31.   Parkinson's disease Father    Heart attack Paternal Uncle    Heart attack Maternal Grandfather    Other Sister        alive and well.   Cancer Sister 68  BRAIN CANCER    Social History Social History   Tobacco Use   Smoking status: Former Smoker    Packs/day: 1.00    Years: 30.00    Pack years: 30.00    Types: Cigarettes    Last attempt to quit: 02/15/2005    Years since quitting: 13.8   Smokeless tobacco: Never Used  Substance Use Topics   Alcohol use: Yes    Alcohol/week: 0.0 standard drinks    Comment: social - not everyday.   Drug use: No     Allergies   Penicillin g potassium in d5w   Review of Systems Review of Systems  Constitutional: Negative for chills and fever.  HENT: Negative for congestion and sore throat.   Eyes: Negative for visual disturbance.  Respiratory: Negative for cough, chest tightness and shortness of breath.   Cardiovascular: Negative for chest pain.    Gastrointestinal: Positive for abdominal pain, nausea and vomiting. Negative for constipation and diarrhea.  Genitourinary: Positive for flank pain. Negative for difficulty urinating, dysuria, hematuria and testicular pain.  Musculoskeletal: Negative for back pain and myalgias.  Skin: Negative for rash.  Neurological: Negative for syncope and light-headedness.     Physical Exam Updated Vital Signs BP 114/74    Pulse 61    Temp 98.2 F (36.8 C) (Oral)    Resp 17    Ht 5\' 10"  (1.778 m)    Wt 133.8 kg    SpO2 97%    BMI 42.33 kg/m   Physical Exam Vitals signs and nursing note reviewed.  Constitutional:      General: He is not in acute distress.    Appearance: He is well-developed.  HENT:     Head: Normocephalic and atraumatic.  Eyes:     Conjunctiva/sclera: Conjunctivae normal.     Pupils: Pupils are equal, round, and reactive to light.  Neck:     Musculoskeletal: Normal range of motion and neck supple.  Cardiovascular:     Rate and Rhythm: Regular rhythm. Bradycardia present.     Heart sounds: S1 normal and S2 normal. No murmur.  Pulmonary:     Effort: Pulmonary effort is normal.     Breath sounds: Normal breath sounds. No wheezing or rales.  Abdominal:     General: There is no distension.     Palpations: Abdomen is soft.     Tenderness: There is abdominal tenderness. There is no guarding.     Comments: Right lower quadrant to right sided abdominal tenderness without guarding or rebound. No CVA tenderness on right.  Musculoskeletal: Normal range of motion.        General: No deformity.  Lymphadenopathy:     Cervical: No cervical adenopathy.  Skin:    General: Skin is warm and dry.     Findings: No erythema or rash.  Neurological:     Mental Status: He is alert.     Comments: Cranial nerves grossly intact. Patient moves extremities symmetrically and with good coordination.  Psychiatric:        Behavior: Behavior normal.        Thought Content: Thought content normal.         Judgment: Judgment normal.      ED Treatments / Results  Labs (all labs ordered are listed, but only abnormal results are displayed) Labs Reviewed  COMPREHENSIVE METABOLIC PANEL - Abnormal; Notable for the following components:      Result Value   CO2 19 (*)    Glucose, Bld 131 (*)  BUN 26 (*)    Creatinine, Ser 1.29 (*)    Total Protein 6.4 (*)    All other components within normal limits  URINALYSIS, ROUTINE W REFLEX MICROSCOPIC - Abnormal; Notable for the following components:   Color, Urine AMBER (*)    APPearance CLOUDY (*)    Hgb urine dipstick LARGE (*)    Protein, ur 30 (*)    All other components within normal limits  CBC WITH DIFFERENTIAL/PLATELET    EKG EKG Interpretation  Date/Time:  Friday Dec 02 2018 09:17:05 EDT Ventricular Rate:  49 PR Interval:    QRS Duration: 97 QT Interval:  468 QTC Calculation: 423 R Axis:   41 Text Interpretation:  Sinus bradycardia no change from previous except bradyccardia Confirmed by Charlesetta Shanks [id number]) on 12/02/2018 1:06:37 PM   Radiology Ct Renal Stone Study  Result Date: 12/02/2018 CLINICAL DATA:  Flank pain with nausea and vomiting EXAM: CT ABDOMEN AND PELVIS WITHOUT CONTRAST TECHNIQUE: Multidetector CT imaging of the abdomen and pelvis was performed following the standard protocol without oral or IV contrast. COMPARISON:  None. FINDINGS: Lower chest: There is atelectatic change in the lung bases. No lung base edema or consolidation evident. Hepatobiliary: There is diffuse hepatic steatosis. No focal liver lesions are evident on this noncontrast enhanced study. There is cholelithiasis. No gallbladder wall thickening evident. There is no appreciable biliary duct dilatation. Pancreas: There is no evident pancreatic mass or inflammatory focus. Spleen: No splenic lesions are appreciable. Adrenals/Urinary Tract: Adrenals bilaterally appear normal. There is no evident renal mass on either side. There is mild  hydronephrosis on the right. There is no appreciable hydronephrosis on the left. There is a 1 mm calculus in the upper pole of the right kidney. There are two adjacent 2 mm calculi in the lower pole of the right kidney. There are scattered tiny calculi throughout the left kidney. There is a 3 mm calculus in the proximal right ureter at the L4 level. No other ureteral calculi are evident on either side. Urinary bladder is midline with wall thickness within normal limits. Stomach/Bowel: There is no appreciable bowel wall or mesenteric thickening. Terminal ileum appears normal. There is no bowel obstruction. No evident free air or portal venous air. Vascular/Lymphatic: There is aortic atherosclerosis. No aneurysm evident. There is no adenopathy in the abdomen or pelvis. Reproductive: There are multiple prostatic calculi. Prostate and seminal vesicles are normal in size and contour. No pelvic mass evident. Other: Appendix appears normal. There is no abscess or ascites in the abdomen or pelvis. There is a small ventral hernia containing only fat. Musculoskeletal: There is degenerative change in the lower thoracic and lumbar regions. No blastic or lytic bone lesions are evident. There is no intramuscular lesion appreciable. IMPRESSION: 1. 3 mm calculus in the right ureter at the L4 level with fairly mild hydronephrosis on the right. 2. Small scattered calculi in each kidney, nonobstructing. Multiple prostatic calculi noted. 3. No bowel obstruction. No abscess in the abdomen or pelvis. Appendix appears normal. 4. Hepatic steatosis. 5. Aortic atherosclerosis. 6. Small ventral hernia containing only fat. Electronically Signed   By: Lowella Grip III M.D.   On: 12/02/2018 10:53    Procedures Procedures (including critical care time)  Medications Ordered in ED Medications  oxyCODONE-acetaminophen (PERCOCET/ROXICET) 5-325 MG per tablet 1 tablet (has no administration in time range)  HYDROmorphone (DILAUDID)  injection 1 mg (1 mg Intravenous Given 12/02/18 0956)  sodium chloride 0.9 % bolus 1,000 mL (1,000 mLs Intravenous New  Bag/Given 12/02/18 1257)     Initial Impression / Assessment and Plan / ED Course  I have reviewed the triage vital signs and the nursing notes.  Pertinent labs & imaging results that were available during my care of the patient were reviewed by me and considered in my medical decision making (see chart for details).        This is a well-appearing 53 year old male with past medical history of nonobstructive CAD, dyslipidemia, hypothyroidism presenting for right flank pain.  He is afebrile and hemodynamically stable.  On arrival to the emergency department describes his pain is primarily in the right lower quadrant.  Differential diagnosis includes ureterolithiasis, appendicitis, diverticulitis.  Do not suspect aortic aneurysm, as patient is experiencing colicky pain, no family history of this, and while he has a history of hypertension, he is a non-smoker.  CT renal stone study demonstrating a 3 mm stone in the proximal ureter.  Mild hydronephrosis on right.  Work-up with normal CBC.  CMP demonstrating slight bump in creatinine and BUN.  CO2 19 but anion gap 8.  Suspect volume depletion as cause of patient's low CO2.  Urinalysis demonstrating large hemoglobin.  No evidence of UTI.  Regarding patient's bradycardia, he is in sinus bradycardia with no evidence of heart block.  Bradycardia not present on EKG in 2016. He is not on a beta-blocker.  He appears asymptomatic.  Will have patient follow-up with primary care provider regarding this finding.  Pain adequately managed and patient given IV fluids.  Toradol held given slight AKI noted today.  After pain managed, patient tolerating p.o. and feels well for discharge.  Patient prescribed narcotic pain medicine, antiemetic, and Flomax.  He is given urology follow-up.  He is instructed on the side effects of narcotic pain medicine.I  have reviewed the patient's information in the [facility] for the past 12 months and found them to have no Rx on record. Opiates were prescribed for an acute, painful condition. The patient was given information on side effects and encouraged to use other, non-opiate pain medication primary, only using opiate medicine sparingly for severe pain.  He is given return precautions for any increasing pain, intractable nausea or vomiting or fever with abdominal pain.  Patient is in understanding and agrees with plan of care.  This is a supervised visit with Dr. Charlesetta Shanks. Evaluation, management, and discharge planning discussed with this attending physician.  Final Clinical Impressions(s) / ED Diagnoses   Final diagnoses:  Right ureteral calculus  AKI (acute kidney injury) [facility])    ED Discharge Orders         Ordered    oxyCODONE-acetaminophen (PERCOCET/ROXICET) 5-325 MG tablet  Every 6 hours PRN     12/02/18 1428    ondansetron (ZOFRAN ODT) 4 MG disintegrating tablet  Every 8 hours PRN     12/02/18 1428    tamsulosin (FLOMAX) 0.4 MG CAPS capsule  Daily     12/02/18 1428           Tamala Julian 12/02/18 1742    Charlesetta Shanks, MD 12/03/18 [phone]

## 2018-12-02 NOTE — ED Notes (Signed)
Patient transported to CT 

## 2018-12-02 NOTE — ED Triage Notes (Signed)
Pt BIB [facility] from home. Pt complaining of flank pain, n/v, and diaphoresis, and bradycardia in the 50s that began @0800  this morning. Pt received 4mg  zofran x2 and 100mg  fentanyl with EMS. Pt has had kidney stones in the past and does have a cardiac history.

## 2018-12-02 NOTE — Discharge Instructions (Addendum)
Please read and follow all provided instructions.  Your diagnoses today include:  1. Right ureteral calculus   2. AKI (acute kidney injury) ([place])     Tests performed today include: Urine test that showed blood in your urine and no infection CT scan which showed a 3 millimeter kidney on the right side Blood test that showed normal kidney function Vital signs. See below for your results today.   Medications prescribed:   Take any prescribed medications only as directed.  Home care instructions:  Follow any educational materials contained in this packet.  Please double your fluid intake for the next several days. Strain your urine and save any stones that may pass.   BE VERY CAREFUL not to take multiple medicines containing Tylenol (also called acetaminophen). Doing so can lead to an overdose which can damage your liver and cause liver failure and possibly death.   Please take Flomax daily for the next few days.  You may take Zofran under the tongue every 6-8 hours as needed for nausea and vomiting.  Follow-up instructions: Please follow-up with your urologist or the urologist referral (provided on front page) in the next 1 week for further evaluation of your symptoms.  If you need to return to the Emergency Department, go to [facility] and not [facility] The urologists are located at [facility] and can better care for you at this location.  Return instructions:  If you need to return to the Emergency Department, go to [facility] and not [facility] The urologists are located at [facility] and can better care for you at this location.  Please return to the Emergency Department if you experience worsening symptoms.  Please return if you develop fever or uncontrolled pain or vomiting. Please return if you have any other emergent concerns.  Additional Information:  Your vital signs today were: BP 114/74    Pulse 61    Temp 98.2 F (36.8  C) (Oral)    Resp 17    Ht 5\' 10"  (1.778 m)    Wt 133.8 kg    SpO2 97%    BMI 42.33 kg/m  If your blood pressure (BP) was elevated above 135/85 this visit, please have this repeated by your doctor within one month. --------------

## 2019-05-21 ENCOUNTER — Emergency Department (HOSPITAL_COMMUNITY)
Admission: EM | Admit: 2019-05-21 | Discharge: 2019-05-21 | Disposition: A | Discharge status: Home / Self Care | Payer: 59 | Attending: Emergency Medicine | Admitting: Emergency Medicine

## 2019-05-21 ENCOUNTER — Emergency Department (HOSPITAL_COMMUNITY): Payer: 59

## 2019-05-21 ENCOUNTER — Other Ambulatory Visit: Payer: Self-pay

## 2019-05-21 ENCOUNTER — Encounter (HOSPITAL_COMMUNITY): Payer: Self-pay | Admitting: *Deleted

## 2019-05-21 DIAGNOSIS — W109XXA Fall (on) (from) unspecified stairs and steps, initial encounter: Secondary | ICD-10-CM | POA: Diagnosis not present

## 2019-05-21 DIAGNOSIS — S80912A Unspecified superficial injury of left knee, initial encounter: Secondary | ICD-10-CM | POA: Insufficient documentation

## 2019-05-21 DIAGNOSIS — Y92009 Unspecified place in unspecified non-institutional (private) residence as the place of occurrence of the external cause: Secondary | ICD-10-CM | POA: Insufficient documentation

## 2019-05-21 DIAGNOSIS — H1131 Conjunctival hemorrhage, right eye: Secondary | ICD-10-CM | POA: Insufficient documentation

## 2019-05-21 DIAGNOSIS — E039 Hypothyroidism, unspecified: Secondary | ICD-10-CM | POA: Insufficient documentation

## 2019-05-21 DIAGNOSIS — Z87891 Personal history of nicotine dependence: Secondary | ICD-10-CM | POA: Insufficient documentation

## 2019-05-21 DIAGNOSIS — Z7982 Long term (current) use of aspirin: Secondary | ICD-10-CM | POA: Diagnosis not present

## 2019-05-21 DIAGNOSIS — Z79899 Other long term (current) drug therapy: Secondary | ICD-10-CM | POA: Insufficient documentation

## 2019-05-21 DIAGNOSIS — I2510 Atherosclerotic heart disease of native coronary artery without angina pectoris: Secondary | ICD-10-CM | POA: Diagnosis not present

## 2019-05-21 DIAGNOSIS — Y939 Activity, unspecified: Secondary | ICD-10-CM | POA: Diagnosis not present

## 2019-05-21 DIAGNOSIS — Y999 Unspecified external cause status: Secondary | ICD-10-CM | POA: Insufficient documentation

## 2019-05-21 DIAGNOSIS — S8992XA Unspecified injury of left lower leg, initial encounter: Secondary | ICD-10-CM

## 2019-05-21 MED ORDER — OXYCODONE-ACETAMINOPHEN 5-325 MG PO TABS: 1.0000 | ORAL_TABLET | ORAL | 0 refills | Status: AC | PRN

## 2019-05-21 MED ORDER — OXYCODONE-ACETAMINOPHEN 5-325 MG PO TABS
1.0000 | ORAL_TABLET | Freq: Once | ORAL | Status: AC
  Administered 2019-05-21: 1 via ORAL
  Filled 2019-05-21: qty 1

## 2019-05-21 MED ORDER — OXYCODONE-ACETAMINOPHEN 5-325 MG PO TABS: 1.0000 | ORAL_TABLET | Freq: Four times a day (QID) | ORAL | 0 refills | Status: DC | PRN

## 2019-05-21 NOTE — ED Provider Notes (Signed)
[place] EMERGENCY DEPARTMENT Provider Note   CSN: [id number] Arrival date & time: 05/21/19  0545     History   Chief Complaint Chief Complaint  Patient presents with  . Knee Pain    HPI Jerome Rogers is a 53 y.o. male.     HPI Patient presents to the emergency department with left knee pain after a fall that occurred last night.  The patient states that he fell after tripping on a step up at his house.  The patient states that he landed on that knee and did count a fall to the right and did hit the area around his right eye but is not having any pain there.  He states he does have a little bleeding in that eye.  Patient states that he did not take any medications prior to arrival for his symptoms.  The patient states that the knee is his only area of pain.  Patient states that certain movements and palpation make the pain worse.  He states that the pain was significant to where he could not sleep last night.  Patient denies back pain, neck pain, numbness, weakness, dizziness, headache, blurred vision, chest pain, shortness of breath, visual loss, near-syncope, or syncope. Past Medical History:  Diagnosis Date  . Anxiety   . CAD (coronary artery disease)    cath 11/29/14    Prox LAD lesion, 20% stenosed.  . Hypothyroidism   . 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 Morbid obesity [facility])     Patient Active Problem List   Diagnosis Date Noted  . Coronary artery disease 12/05/2014  . Dyslipidemia 12/05/2014  . 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 Chest pain   . Hypothyroidism 08/18/2013    Past Surgical History:  Procedure Laterality Date  . APPENDECTOMY    . CARDIAC CATHETERIZATION N/A 11/29/2014   Procedure: Left Heart Cath and Coronary Angiography;  Surgeon: Troy Sine, MD;  Location: [place] [facility];  Service: Cardiovascular;  Laterality: N/A;  . Varicose vein stripping          Home Medications     Prior to Admission medications   Medication Sig Start Date End Date Taking? Authorizing Provider  aspirin EC 81 MG EC tablet Take 1 tablet (81 mg total) by mouth daily. 11/30/14   Bhagat, Crista Luria, PA  atorvastatin (LIPITOR) 40 MG tablet Take 1 tablet (40 mg total) by mouth daily at 6 PM. PLEASE CONTACT OFFICE FOR ADDITIONAL REFILLS Patient not taking: Reported on 12/02/2018 02/17/16   Croitoru, Mihai, MD  levothyroxine (SYNTHROID, LEVOTHROID) 125 MCG tablet Take 250 mcg by mouth daily before breakfast.     [provider]  meloxicam (MOBIC) 7.5 MG tablet Take 7.5 mg by mouth 2 (two) times daily. 11/23/18   [provider]  ondansetron (ZOFRAN ODT) 4 MG disintegrating tablet Take 1 tablet (4 mg total) by mouth every 8 (eight) hours as needed for nausea or vomiting. 12/02/18   Langston Masker B, PA-C  oxyCODONE-acetaminophen (PERCOCET/ROXICET) 5-325 MG tablet Take 1 tablet by mouth every 6 (six) hours as needed for severe pain. 12/02/18   Langston Masker B, PA-C  tamsulosin (FLOMAX) 0.4 MG CAPS capsule Take 1 capsule (0.4 mg total) by mouth daily. 12/02/18   Albesa Seen, PA-C    Family History Family History  Problem Relation Age of Onset  . Cancer Mother        alive and well.  . Alzheimer's  disease Father   . CAD Father        cabg and redo cabg - began in late 66's.  died @ 50.  . Parkinson's disease Father   . Heart attack Paternal Uncle   . Heart attack Maternal Grandfather   . Other Sister        alive and well.  . Cancer Sister 65       BRAIN CANCER    Social History Social History   Tobacco Use  . Smoking status: Former Smoker    Packs/day: 1.00    Years: 30.00    Pack years: 30.00    Types: Cigarettes    Quit date: 02/15/2005    Years since quitting: 14.2  . Smokeless tobacco: Never Used  Substance Use Topics  . Alcohol use: Yes    Alcohol/week: 0.0 standard drinks    Comment: social - not everyday.  . Drug use: No     Allergies   Penicillin  g potassium in d5w   Review of Systems Review of Systems  All other systems negative except as documented in the HPI. All pertinent positives and negatives as reviewed in the HPI. Physical Exam Updated Vital Signs BP (!) 143/105   Pulse 94   Temp 98.3 F (36.8 C) (Oral)   Resp 16   SpO2 97%   Physical Exam Vitals signs and nursing note reviewed.  Constitutional:      General: He is not in acute distress.    Appearance: He is well-developed.  HENT:     Head: Normocephalic and atraumatic.  Eyes:     General: Lids are normal. Vision grossly intact. No visual field deficit.       Right eye: No discharge.        Left eye: No discharge.     Extraocular Movements: Extraocular movements intact.     Right eye: Normal extraocular motion and no nystagmus.     Left eye: Normal extraocular motion and no nystagmus.     Conjunctiva/sclera:     Right eye: Hemorrhage present.     Pupils: Pupils are equal, round, and reactive to light.  Neck:     Musculoskeletal: Normal range of motion and neck supple.  Cardiovascular:     Rate and Rhythm: Normal rate and regular rhythm.     Heart sounds: Normal heart sounds. No murmur. No friction rub. No gallop.   Pulmonary:     Effort: Pulmonary effort is normal. No respiratory distress.     Breath sounds: Normal breath sounds. No wheezing.  Skin:    General: Skin is warm and dry.     Capillary Refill: Capillary refill takes less than 2 seconds.     Findings: No erythema or rash.  Neurological:     Mental Status: He is alert and oriented to person, place, and time.     Motor: No abnormal muscle tone.     Coordination: Coordination normal.  Psychiatric:        Behavior: Behavior normal.      ED Treatments / Results  Labs (all labs ordered are listed, but only abnormal results are displayed) Labs Reviewed - No data to display  EKG None  Radiology Dg Knee Complete 4 Views Left  Result Date: 05/21/2019 CLINICAL DATA:  Fall.  Recent  knee injury. EXAM: LEFT KNEE - COMPLETE 4+ VIEW COMPARISON:  None FINDINGS: Previous left knee arthroplasty. The hardware components are in anatomic alignment. No periprosthetic fracture or subluxation. Small suprapatellar joint  effusion. Diffuse soft tissue swelling. IMPRESSION: 1. No acute fracture or dislocation. 2. Small suprapatellar joint effusion and soft tissue swelling. Electronically Signed   By: Kerby Moors M.D.   On: 05/21/2019 06:43    Procedures Procedures (including critical care time)  Medications Ordered in ED Medications  oxyCODONE-acetaminophen (PERCOCET/ROXICET) 5-325 MG per tablet 1 tablet (1 tablet Oral Given 05/21/19 0840)     Initial Impression / Assessment and Plan / ED Course  I have reviewed the triage vital signs and the nursing notes.  Pertinent labs & imaging results that were available during my care of the patient were reviewed by me and considered in my medical decision making (see chart for details).      Patient does not have any neurological deficits noted on examination.  He does have a subconjunctival hemorrhage but no periorbital swelling and extraocular movements are intact.  He does not have any visual changes.  At this point the patient is not having any headache or other symptoms of pain other than his left knee.  The patient will be treated for this pain.  I did advise him to follow-up with his orthopedist for further evaluation and care of this.  Patient agrees this plan and all questions were answered.    Final Clinical Impressions(s) / ED Diagnoses   Final diagnoses:  None    ED Discharge Orders    None       Dalia Heading, PA-C 05/21/19 [id number]    Lucrezia Starch, MD 05/23/19 2317

## 2019-05-21 NOTE — ED Triage Notes (Signed)
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 Surgical incision healed appropriately. Tonight was going from laundry room to another room and stepped on uneven flooring, falling to L knee. Swelling noted. Difficulty ambulating due to pain

## 2019-05-21 NOTE — Discharge Instructions (Signed)
Return here for any worsening in your condition.  I would like for you to call your orthopedist and schedule follow-up as soon as possible.  Ice and elevate the knee.

## 2021-02-21 DIAGNOSIS — R519 Headache, unspecified: Secondary | ICD-10-CM | POA: Diagnosis not present

## 2021-02-21 DIAGNOSIS — R5383 Other fatigue: Secondary | ICD-10-CM | POA: Diagnosis not present

## 2021-02-21 DIAGNOSIS — U071 COVID-19: Secondary | ICD-10-CM | POA: Diagnosis not present

## 2021-02-21 DIAGNOSIS — R52 Pain, unspecified: Secondary | ICD-10-CM | POA: Diagnosis not present

## 2021-02-21 DIAGNOSIS — R059 Cough, unspecified: Secondary | ICD-10-CM | POA: Diagnosis not present

## 2021-03-26 DIAGNOSIS — Z13220 Encounter for screening for lipoid disorders: Secondary | ICD-10-CM | POA: Diagnosis not present

## 2021-03-26 DIAGNOSIS — F419 Anxiety disorder, unspecified: Secondary | ICD-10-CM | POA: Diagnosis not present

## 2021-03-26 DIAGNOSIS — E039 Hypothyroidism, unspecified: Secondary | ICD-10-CM | POA: Diagnosis not present

## 2021-03-26 DIAGNOSIS — G4700 Insomnia, unspecified: Secondary | ICD-10-CM | POA: Diagnosis not present

## 2021-03-26 DIAGNOSIS — Z131 Encounter for screening for diabetes mellitus: Secondary | ICD-10-CM | POA: Diagnosis not present

## 2021-04-22 DIAGNOSIS — R2681 Unsteadiness on feet: Secondary | ICD-10-CM | POA: Diagnosis not present

## 2021-04-22 DIAGNOSIS — I358 Other nonrheumatic aortic valve disorders: Secondary | ICD-10-CM | POA: Diagnosis not present

## 2021-04-22 DIAGNOSIS — I517 Cardiomegaly: Secondary | ICD-10-CM | POA: Diagnosis not present

## 2021-04-22 DIAGNOSIS — G459 Transient cerebral ischemic attack, unspecified: Secondary | ICD-10-CM | POA: Diagnosis not present

## 2021-04-23 DIAGNOSIS — E039 Hypothyroidism, unspecified: Secondary | ICD-10-CM | POA: Diagnosis not present

## 2021-04-23 DIAGNOSIS — G4700 Insomnia, unspecified: Secondary | ICD-10-CM | POA: Diagnosis not present

## 2021-04-23 DIAGNOSIS — E785 Hyperlipidemia, unspecified: Secondary | ICD-10-CM | POA: Diagnosis not present

## 2021-04-23 DIAGNOSIS — G459 Transient cerebral ischemic attack, unspecified: Secondary | ICD-10-CM | POA: Diagnosis not present

## 2021-04-24 DIAGNOSIS — I6529 Occlusion and stenosis of unspecified carotid artery: Secondary | ICD-10-CM | POA: Diagnosis not present

## 2021-04-24 DIAGNOSIS — F419 Anxiety disorder, unspecified: Secondary | ICD-10-CM | POA: Diagnosis not present

## 2021-04-24 DIAGNOSIS — I2510 Atherosclerotic heart disease of native coronary artery without angina pectoris: Secondary | ICD-10-CM | POA: Diagnosis not present

## 2021-04-30 DIAGNOSIS — G459 Transient cerebral ischemic attack, unspecified: Secondary | ICD-10-CM | POA: Diagnosis not present

## 2021-04-30 DIAGNOSIS — R0683 Snoring: Secondary | ICD-10-CM | POA: Diagnosis not present

## 2021-04-30 DIAGNOSIS — G4710 Hypersomnia, unspecified: Secondary | ICD-10-CM | POA: Diagnosis not present

## 2021-04-30 DIAGNOSIS — E785 Hyperlipidemia, unspecified: Secondary | ICD-10-CM | POA: Diagnosis not present

## 2021-05-05 DIAGNOSIS — R569 Unspecified convulsions: Secondary | ICD-10-CM | POA: Diagnosis not present

## 2021-05-06 DIAGNOSIS — R569 Unspecified convulsions: Secondary | ICD-10-CM | POA: Diagnosis not present

## 2021-05-12 DIAGNOSIS — I519 Heart disease, unspecified: Secondary | ICD-10-CM | POA: Diagnosis not present

## 2021-05-12 DIAGNOSIS — R29818 Other symptoms and signs involving the nervous system: Secondary | ICD-10-CM | POA: Diagnosis not present

## 2021-05-12 DIAGNOSIS — T887XXA Unspecified adverse effect of drug or medicament, initial encounter: Secondary | ICD-10-CM | POA: Diagnosis not present

## 2021-05-15 DIAGNOSIS — G4730 Sleep apnea, unspecified: Secondary | ICD-10-CM | POA: Diagnosis not present

## 2021-05-15 DIAGNOSIS — G4700 Insomnia, unspecified: Secondary | ICD-10-CM | POA: Diagnosis not present

## 2021-05-15 DIAGNOSIS — R0683 Snoring: Secondary | ICD-10-CM | POA: Diagnosis not present

## 2021-05-19 DIAGNOSIS — R0683 Snoring: Secondary | ICD-10-CM | POA: Diagnosis not present

## 2021-05-19 DIAGNOSIS — G4733 Obstructive sleep apnea (adult) (pediatric): Secondary | ICD-10-CM | POA: Diagnosis not present

## 2021-05-19 DIAGNOSIS — G4710 Hypersomnia, unspecified: Secondary | ICD-10-CM | POA: Diagnosis not present

## 2021-05-20 DIAGNOSIS — I6521 Occlusion and stenosis of right carotid artery: Secondary | ICD-10-CM | POA: Diagnosis not present

## 2021-05-20 DIAGNOSIS — I2510 Atherosclerotic heart disease of native coronary artery without angina pectoris: Secondary | ICD-10-CM | POA: Diagnosis not present

## 2021-05-20 DIAGNOSIS — H532 Diplopia: Secondary | ICD-10-CM | POA: Diagnosis not present

## 2021-05-20 DIAGNOSIS — R270 Ataxia, unspecified: Secondary | ICD-10-CM | POA: Diagnosis not present

## 2021-05-23 DIAGNOSIS — G4733 Obstructive sleep apnea (adult) (pediatric): Secondary | ICD-10-CM | POA: Diagnosis not present

## 2021-06-22 DIAGNOSIS — G4733 Obstructive sleep apnea (adult) (pediatric): Secondary | ICD-10-CM | POA: Diagnosis not present

## 2021-07-14 DIAGNOSIS — T887XXA Unspecified adverse effect of drug or medicament, initial encounter: Secondary | ICD-10-CM | POA: Diagnosis not present

## 2021-07-23 DIAGNOSIS — G4733 Obstructive sleep apnea (adult) (pediatric): Secondary | ICD-10-CM | POA: Diagnosis not present

## 2021-08-07 DIAGNOSIS — Z9989 Dependence on other enabling machines and devices: Secondary | ICD-10-CM | POA: Diagnosis not present

## 2021-08-07 DIAGNOSIS — G4733 Obstructive sleep apnea (adult) (pediatric): Secondary | ICD-10-CM | POA: Diagnosis not present

## 2021-08-23 DIAGNOSIS — G4733 Obstructive sleep apnea (adult) (pediatric): Secondary | ICD-10-CM | POA: Diagnosis not present

## 2021-09-20 DIAGNOSIS — G4733 Obstructive sleep apnea (adult) (pediatric): Secondary | ICD-10-CM | POA: Diagnosis not present

## 2021-09-24 DIAGNOSIS — R1310 Dysphagia, unspecified: Secondary | ICD-10-CM | POA: Diagnosis not present

## 2021-09-24 DIAGNOSIS — Z86010 Personal history of colonic polyps: Secondary | ICD-10-CM | POA: Diagnosis not present

## 2021-09-24 DIAGNOSIS — D120 Benign neoplasm of cecum: Secondary | ICD-10-CM | POA: Diagnosis not present

## 2021-10-21 DIAGNOSIS — G4733 Obstructive sleep apnea (adult) (pediatric): Secondary | ICD-10-CM | POA: Diagnosis not present

## 2021-11-20 DIAGNOSIS — G4733 Obstructive sleep apnea (adult) (pediatric): Secondary | ICD-10-CM | POA: Diagnosis not present

## 2022-02-19 DIAGNOSIS — M25562 Pain in left knee: Secondary | ICD-10-CM | POA: Diagnosis not present

## 2022-03-18 DIAGNOSIS — F419 Anxiety disorder, unspecified: Secondary | ICD-10-CM | POA: Diagnosis not present

## 2022-03-18 DIAGNOSIS — Z79899 Other long term (current) drug therapy: Secondary | ICD-10-CM | POA: Diagnosis not present

## 2022-03-18 DIAGNOSIS — Z131 Encounter for screening for diabetes mellitus: Secondary | ICD-10-CM | POA: Diagnosis not present

## 2022-03-18 DIAGNOSIS — E7800 Pure hypercholesterolemia, unspecified: Secondary | ICD-10-CM | POA: Diagnosis not present

## 2022-03-18 DIAGNOSIS — E039 Hypothyroidism, unspecified: Secondary | ICD-10-CM | POA: Diagnosis not present

## 2022-03-18 DIAGNOSIS — G4700 Insomnia, unspecified: Secondary | ICD-10-CM | POA: Diagnosis not present

## 2022-03-23 DIAGNOSIS — R21 Rash and other nonspecific skin eruption: Secondary | ICD-10-CM | POA: Diagnosis not present

## 2022-03-23 DIAGNOSIS — U071 COVID-19: Secondary | ICD-10-CM | POA: Diagnosis not present

## 2022-10-13 DIAGNOSIS — F419 Anxiety disorder, unspecified: Secondary | ICD-10-CM | POA: Diagnosis not present

## 2022-10-13 DIAGNOSIS — E039 Hypothyroidism, unspecified: Secondary | ICD-10-CM | POA: Diagnosis not present

## 2022-10-13 DIAGNOSIS — G4700 Insomnia, unspecified: Secondary | ICD-10-CM | POA: Diagnosis not present

## 2022-12-22 DIAGNOSIS — M1711 Unilateral primary osteoarthritis, right knee: Secondary | ICD-10-CM | POA: Diagnosis not present

## 2022-12-22 DIAGNOSIS — M25561 Pain in right knee: Secondary | ICD-10-CM | POA: Diagnosis not present

## 2023-04-15 DIAGNOSIS — Z131 Encounter for screening for diabetes mellitus: Secondary | ICD-10-CM | POA: Diagnosis not present

## 2023-04-15 DIAGNOSIS — I2510 Atherosclerotic heart disease of native coronary artery without angina pectoris: Secondary | ICD-10-CM | POA: Diagnosis not present

## 2023-04-15 DIAGNOSIS — G4700 Insomnia, unspecified: Secondary | ICD-10-CM | POA: Diagnosis not present

## 2023-04-15 DIAGNOSIS — E039 Hypothyroidism, unspecified: Secondary | ICD-10-CM | POA: Diagnosis not present

## 2023-04-15 DIAGNOSIS — E7800 Pure hypercholesterolemia, unspecified: Secondary | ICD-10-CM | POA: Diagnosis not present

## 2023-04-15 DIAGNOSIS — F419 Anxiety disorder, unspecified: Secondary | ICD-10-CM | POA: Diagnosis not present

## 2023-07-20 DIAGNOSIS — M1711 Unilateral primary osteoarthritis, right knee: Secondary | ICD-10-CM | POA: Diagnosis not present

## 2023-08-13 DIAGNOSIS — G5603 Carpal tunnel syndrome, bilateral upper limbs: Secondary | ICD-10-CM | POA: Diagnosis not present

## 2023-08-13 DIAGNOSIS — R202 Paresthesia of skin: Secondary | ICD-10-CM | POA: Diagnosis not present

## 2023-08-13 DIAGNOSIS — R200 Anesthesia of skin: Secondary | ICD-10-CM | POA: Diagnosis not present

## 2023-08-31 DIAGNOSIS — L918 Other hypertrophic disorders of the skin: Secondary | ICD-10-CM | POA: Diagnosis not present

## 2023-10-14 DIAGNOSIS — I2510 Atherosclerotic heart disease of native coronary artery without angina pectoris: Secondary | ICD-10-CM | POA: Diagnosis not present

## 2023-10-14 DIAGNOSIS — E538 Deficiency of other specified B group vitamins: Secondary | ICD-10-CM | POA: Diagnosis not present

## 2023-10-14 DIAGNOSIS — G4700 Insomnia, unspecified: Secondary | ICD-10-CM | POA: Diagnosis not present

## 2023-10-14 DIAGNOSIS — F419 Anxiety disorder, unspecified: Secondary | ICD-10-CM | POA: Diagnosis not present

## 2023-10-14 DIAGNOSIS — E039 Hypothyroidism, unspecified: Secondary | ICD-10-CM | POA: Diagnosis not present

## 2024-04-20 DIAGNOSIS — K920 Hematemesis: Secondary | ICD-10-CM | POA: Diagnosis not present

## 2024-04-20 DIAGNOSIS — K219 Gastro-esophageal reflux disease without esophagitis: Secondary | ICD-10-CM | POA: Diagnosis not present

## 2024-04-20 DIAGNOSIS — D126 Benign neoplasm of colon, unspecified: Secondary | ICD-10-CM | POA: Diagnosis not present

## 2024-04-20 DIAGNOSIS — R1310 Dysphagia, unspecified: Secondary | ICD-10-CM | POA: Diagnosis not present

## 2024-05-03 DIAGNOSIS — E7800 Pure hypercholesterolemia, unspecified: Secondary | ICD-10-CM | POA: Diagnosis not present

## 2024-05-03 DIAGNOSIS — E538 Deficiency of other specified B group vitamins: Secondary | ICD-10-CM | POA: Diagnosis not present

## 2024-05-03 DIAGNOSIS — Z79899 Other long term (current) drug therapy: Secondary | ICD-10-CM | POA: Diagnosis not present

## 2024-05-03 DIAGNOSIS — G4700 Insomnia, unspecified: Secondary | ICD-10-CM | POA: Diagnosis not present

## 2024-05-03 DIAGNOSIS — F419 Anxiety disorder, unspecified: Secondary | ICD-10-CM | POA: Diagnosis not present

## 2024-05-03 DIAGNOSIS — E039 Hypothyroidism, unspecified: Secondary | ICD-10-CM | POA: Diagnosis not present
